# Patient Record
Sex: Female | Born: 1950 | Race: White | Hispanic: No | Marital: Married | Smoking: Never smoker
Health system: Southern US, Community
[De-identification: ages and names within clinical notes are randomized; demographics above are authoritative.]

## PROBLEM LIST (undated history)

## (undated) DIAGNOSIS — M1990 Unspecified osteoarthritis, unspecified site: Secondary | ICD-10-CM

## (undated) DIAGNOSIS — K5900 Constipation, unspecified: Secondary | ICD-10-CM

## (undated) DIAGNOSIS — J45909 Unspecified asthma, uncomplicated: Secondary | ICD-10-CM

## (undated) DIAGNOSIS — F32A Depression, unspecified: Secondary | ICD-10-CM

## (undated) DIAGNOSIS — K219 Gastro-esophageal reflux disease without esophagitis: Secondary | ICD-10-CM

## (undated) DIAGNOSIS — R011 Cardiac murmur, unspecified: Secondary | ICD-10-CM

## (undated) DIAGNOSIS — R42 Dizziness and giddiness: Secondary | ICD-10-CM

## (undated) DIAGNOSIS — D649 Anemia, unspecified: Secondary | ICD-10-CM

## (undated) DIAGNOSIS — K589 Irritable bowel syndrome without diarrhea: Secondary | ICD-10-CM

## (undated) DIAGNOSIS — E079 Disorder of thyroid, unspecified: Secondary | ICD-10-CM

## (undated) DIAGNOSIS — F329 Major depressive disorder, single episode, unspecified: Secondary | ICD-10-CM

## (undated) DIAGNOSIS — E7800 Pure hypercholesterolemia, unspecified: Secondary | ICD-10-CM

## (undated) DIAGNOSIS — E039 Hypothyroidism, unspecified: Secondary | ICD-10-CM

## (undated) DIAGNOSIS — F419 Anxiety disorder, unspecified: Secondary | ICD-10-CM

## (undated) DIAGNOSIS — IMO0002 Reserved for concepts with insufficient information to code with codable children: Secondary | ICD-10-CM

## (undated) DIAGNOSIS — R112 Nausea with vomiting, unspecified: Secondary | ICD-10-CM

## (undated) DIAGNOSIS — M5430 Sciatica, unspecified side: Secondary | ICD-10-CM

## (undated) DIAGNOSIS — R9431 Abnormal electrocardiogram [ECG] [EKG]: Secondary | ICD-10-CM

## (undated) HISTORY — DX: Disorder of thyroid, unspecified: E07.9

## (undated) HISTORY — PX: TONSILLECTOMY: SUR1361

## (undated) HISTORY — DX: Unspecified osteoarthritis, unspecified site: M19.90

## (undated) HISTORY — DX: Sciatica, unspecified side: M54.30

## (undated) HISTORY — DX: Abnormal electrocardiogram (ECG) (EKG): R94.31

## (undated) HISTORY — PX: CHOLECYSTECTOMY: SHX55

## (undated) HISTORY — PX: OTHER SURGICAL HISTORY: SHX169

## (undated) HISTORY — PX: CARPAL TUNNEL RELEASE: SHX101

## (undated) HISTORY — DX: Reserved for concepts with insufficient information to code with codable children: IMO0002

## (undated) HISTORY — DX: Major depressive disorder, single episode, unspecified: F32.9

## (undated) HISTORY — DX: Gastro-esophageal reflux disease without esophagitis: K21.9

## (undated) HISTORY — DX: Nausea with vomiting, unspecified: R11.2

## (undated) HISTORY — DX: Unspecified asthma, uncomplicated: J45.909

## (undated) HISTORY — DX: Anxiety disorder, unspecified: F41.9

## (undated) HISTORY — DX: Constipation, unspecified: K59.00

## (undated) HISTORY — DX: Pure hypercholesterolemia, unspecified: E78.00

## (undated) HISTORY — DX: Anemia, unspecified: D64.9

---

## 1988-07-19 HISTORY — PX: OVARY SURGERY: SHX727

## 1988-09-15 HISTORY — PX: ABDOMINAL HYSTERECTOMY: SHX81

## 1994-10-01 HISTORY — PX: SHOULDER SURGERY: SHX246

## 1998-03-14 ENCOUNTER — Other Ambulatory Visit: Admission: RE | Admit: 1998-03-14 | Discharge: 1998-03-14 | Payer: Self-pay | Admitting: Gynecology

## 1998-03-24 ENCOUNTER — Encounter: Admission: RE | Admit: 1998-03-24 | Discharge: 1998-06-22 | Payer: Self-pay

## 1999-05-25 ENCOUNTER — Other Ambulatory Visit: Admission: RE | Admit: 1999-05-25 | Discharge: 1999-05-25 | Payer: Self-pay | Admitting: Gynecology

## 1999-06-08 ENCOUNTER — Ambulatory Visit (HOSPITAL_COMMUNITY): Admission: RE | Admit: 1999-06-08 | Discharge: 1999-06-08 | Payer: Self-pay | Admitting: *Deleted

## 1999-06-08 ENCOUNTER — Encounter (INDEPENDENT_AMBULATORY_CARE_PROVIDER_SITE_OTHER): Payer: Self-pay

## 2000-06-18 ENCOUNTER — Other Ambulatory Visit: Admission: RE | Admit: 2000-06-18 | Discharge: 2000-06-18 | Payer: Self-pay | Admitting: Gynecology

## 2000-10-01 HISTORY — PX: BREAST LUMPECTOMY: SHX2

## 2001-05-20 ENCOUNTER — Other Ambulatory Visit: Admission: RE | Admit: 2001-05-20 | Discharge: 2001-05-20 | Payer: Self-pay | Admitting: Gynecology

## 2003-03-16 ENCOUNTER — Other Ambulatory Visit: Admission: RE | Admit: 2003-03-16 | Discharge: 2003-03-16 | Payer: Self-pay | Admitting: Gynecology

## 2004-01-05 ENCOUNTER — Encounter: Admission: RE | Admit: 2004-01-05 | Discharge: 2004-01-05 | Payer: Self-pay | Admitting: Critical Care Medicine

## 2004-04-13 ENCOUNTER — Other Ambulatory Visit: Admission: RE | Admit: 2004-04-13 | Discharge: 2004-04-13 | Payer: Self-pay | Admitting: Gynecology

## 2004-08-15 ENCOUNTER — Ambulatory Visit: Payer: Self-pay | Admitting: Hematology & Oncology

## 2004-10-09 ENCOUNTER — Ambulatory Visit: Payer: Self-pay | Admitting: Hematology & Oncology

## 2004-11-13 ENCOUNTER — Ambulatory Visit: Payer: Self-pay | Admitting: General Surgery

## 2004-11-28 ENCOUNTER — Ambulatory Visit: Payer: Self-pay | Admitting: Hematology & Oncology

## 2005-02-15 ENCOUNTER — Encounter (INDEPENDENT_AMBULATORY_CARE_PROVIDER_SITE_OTHER): Payer: Self-pay | Admitting: Specialist

## 2005-02-15 ENCOUNTER — Observation Stay (HOSPITAL_COMMUNITY): Admission: RE | Admit: 2005-02-15 | Discharge: 2005-02-16 | Payer: Self-pay | Admitting: General Surgery

## 2005-02-28 ENCOUNTER — Ambulatory Visit: Payer: Self-pay | Admitting: Hematology & Oncology

## 2005-03-12 ENCOUNTER — Emergency Department (HOSPITAL_COMMUNITY): Admission: EM | Admit: 2005-03-12 | Discharge: 2005-03-12 | Payer: Self-pay | Admitting: Emergency Medicine

## 2005-03-15 ENCOUNTER — Encounter: Admission: RE | Admit: 2005-03-15 | Discharge: 2005-03-15 | Payer: Self-pay | Admitting: General Surgery

## 2005-05-04 ENCOUNTER — Ambulatory Visit: Payer: Self-pay | Admitting: Hematology & Oncology

## 2005-09-11 ENCOUNTER — Other Ambulatory Visit: Admission: RE | Admit: 2005-09-11 | Discharge: 2005-09-11 | Payer: Self-pay | Admitting: Gynecology

## 2006-05-08 ENCOUNTER — Encounter: Admission: RE | Admit: 2006-05-08 | Discharge: 2006-05-08 | Payer: Self-pay | Admitting: Family Medicine

## 2007-07-01 ENCOUNTER — Ambulatory Visit: Payer: Self-pay | Admitting: Hematology & Oncology

## 2007-07-01 LAB — CBC & DIFF AND RETIC
Basophils Absolute: 0 10*3/uL (ref 0.0–0.1)
Eosinophils Absolute: 0.2 10*3/uL (ref 0.0–0.5)
HCT: 28.8 % — ABNORMAL LOW (ref 34.8–46.6)
HGB: 9.6 g/dL — ABNORMAL LOW (ref 11.6–15.9)
IRF: 0.34 — ABNORMAL HIGH (ref 0.130–0.330)
LYMPH%: 16.9 % (ref 14.0–48.0)
MONO#: 0.7 10*3/uL (ref 0.1–0.9)
NEUT%: 72.5 % (ref 39.6–76.8)
Platelets: 489 10*3/uL — ABNORMAL HIGH (ref 145–400)
WBC: 8.1 10*3/uL (ref 3.9–10.0)
lymph#: 1.4 10*3/uL (ref 0.9–3.3)

## 2007-07-01 LAB — FERRITIN: Ferritin: 4 ng/mL — ABNORMAL LOW (ref 10–291)

## 2007-08-07 LAB — CBC & DIFF AND RETIC
BASO%: 0.6 % (ref 0.0–2.0)
Basophils Absolute: 0 10*3/uL (ref 0.0–0.1)
EOS%: 2.1 % (ref 0.0–7.0)
HCT: 33.6 % — ABNORMAL LOW (ref 34.8–46.6)
HGB: 11.3 g/dL — ABNORMAL LOW (ref 11.6–15.9)
IRF: 0.31 (ref 0.130–0.330)
LYMPH%: 18.6 % (ref 14.0–48.0)
MCH: 26.1 pg (ref 26.0–34.0)
MCHC: 33.6 g/dL (ref 32.0–36.0)
MCV: 77.5 fL — ABNORMAL LOW (ref 81.0–101.0)
NEUT%: 70.3 % (ref 39.6–76.8)
Platelets: 426 10*3/uL — ABNORMAL HIGH (ref 145–400)
lymph#: 0.9 10*3/uL (ref 0.9–3.3)

## 2007-08-07 LAB — CHCC SMEAR

## 2007-08-15 ENCOUNTER — Encounter: Admission: RE | Admit: 2007-08-15 | Discharge: 2007-08-15 | Payer: Self-pay | Admitting: Sports Medicine

## 2007-08-22 ENCOUNTER — Encounter: Admission: RE | Admit: 2007-08-22 | Discharge: 2007-08-22 | Payer: Self-pay | Admitting: Sports Medicine

## 2007-08-29 ENCOUNTER — Encounter: Admission: RE | Admit: 2007-08-29 | Discharge: 2007-08-29 | Payer: Self-pay | Admitting: Sports Medicine

## 2007-09-12 ENCOUNTER — Encounter: Admission: RE | Admit: 2007-09-12 | Discharge: 2007-09-12 | Payer: Self-pay | Admitting: Sports Medicine

## 2008-02-05 ENCOUNTER — Encounter: Admission: RE | Admit: 2008-02-05 | Discharge: 2008-02-05 | Payer: Self-pay | Admitting: Sports Medicine

## 2008-03-18 ENCOUNTER — Encounter: Admission: RE | Admit: 2008-03-18 | Discharge: 2008-03-18 | Payer: Self-pay | Admitting: Sports Medicine

## 2008-07-30 ENCOUNTER — Encounter: Admission: RE | Admit: 2008-07-30 | Discharge: 2008-07-30 | Payer: Self-pay | Admitting: Internal Medicine

## 2008-10-18 ENCOUNTER — Encounter: Admission: RE | Admit: 2008-10-18 | Discharge: 2008-10-18 | Payer: Self-pay | Admitting: Sports Medicine

## 2008-11-28 ENCOUNTER — Encounter: Admission: RE | Admit: 2008-11-28 | Discharge: 2008-11-28 | Payer: Self-pay | Admitting: Orthopaedic Surgery

## 2008-11-30 ENCOUNTER — Ambulatory Visit: Payer: Self-pay | Admitting: Hematology & Oncology

## 2008-11-30 LAB — CBC & DIFF AND RETIC
BASO%: 0.7 % (ref 0.0–2.0)
EOS%: 3.2 % (ref 0.0–7.0)
HCT: 25.9 % — ABNORMAL LOW (ref 34.8–46.6)
IRF: 0.28 (ref 0.130–0.330)
MCH: 20.7 pg — ABNORMAL LOW (ref 25.1–34.0)
MCHC: 31.1 g/dL — ABNORMAL LOW (ref 31.5–36.0)
MONO#: 0.5 10*3/uL (ref 0.1–0.9)
NEUT%: 65 % (ref 38.4–76.8)
RBC: 3.89 10*6/uL (ref 3.70–5.45)
Retic %: 2.4 % — ABNORMAL HIGH (ref 0.4–2.3)
WBC: 6.6 10*3/uL (ref 3.9–10.3)
lymph#: 1.5 10*3/uL (ref 0.9–3.3)

## 2008-11-30 LAB — COMPREHENSIVE METABOLIC PANEL
AST: 11 U/L (ref 0–37)
BUN: 13 mg/dL (ref 6–23)
CO2: 26 mEq/L (ref 19–32)
Calcium: 9 mg/dL (ref 8.4–10.5)
Chloride: 106 mEq/L (ref 96–112)
Creatinine, Ser: 0.71 mg/dL (ref 0.40–1.20)
Total Bilirubin: 0.2 mg/dL — ABNORMAL LOW (ref 0.3–1.2)

## 2008-11-30 LAB — FERRITIN: Ferritin: 2 ng/mL — ABNORMAL LOW (ref 10–291)

## 2008-12-06 ENCOUNTER — Ambulatory Visit: Payer: Self-pay | Admitting: Hematology & Oncology

## 2009-01-04 ENCOUNTER — Encounter: Admission: RE | Admit: 2009-01-04 | Discharge: 2009-01-04 | Payer: Self-pay | Admitting: Internal Medicine

## 2009-01-17 LAB — CBC WITH DIFFERENTIAL (CANCER CENTER ONLY)
BASO#: 0 10e3/uL (ref 0.0–0.2)
BASO%: 0.3 % (ref 0.0–2.0)
EOS%: 1.5 % (ref 0.0–7.0)
Eosinophils Absolute: 0.1 10e3/uL (ref 0.0–0.5)
HCT: 35.7 % (ref 34.8–46.6)
HGB: 11.5 g/dL — ABNORMAL LOW (ref 11.6–15.9)
LYMPH#: 1.4 10e3/uL (ref 0.9–3.3)
LYMPH%: 17.7 % (ref 14.0–48.0)
MCH: 25.4 pg — ABNORMAL LOW (ref 26.0–34.0)
MCHC: 32.4 g/dL (ref 32.0–36.0)
MCV: 79 fL — ABNORMAL LOW (ref 81–101)
MONO#: 0.4 10e3/uL (ref 0.1–0.9)
MONO%: 5.4 % (ref 0.0–13.0)
NEUT#: 6.1 10e3/uL (ref 1.5–6.5)
NEUT%: 75.1 % (ref 39.6–80.0)
Platelets: 370 10e3/uL (ref 145–400)
RBC: 4.54 10e6/uL (ref 3.70–5.32)
RDW: 24 % — ABNORMAL HIGH (ref 10.5–14.6)
WBC: 8.2 10e3/uL (ref 3.9–10.0)

## 2009-01-17 LAB — FERRITIN: Ferritin: 20 ng/mL (ref 10–291)

## 2009-01-17 LAB — RETICULOCYTES (CHCC): ABS Retic: 23.3 10*3/uL (ref 19.0–186.0)

## 2009-01-24 ENCOUNTER — Emergency Department (HOSPITAL_BASED_OUTPATIENT_CLINIC_OR_DEPARTMENT_OTHER): Admission: EM | Admit: 2009-01-24 | Discharge: 2009-01-24 | Payer: Self-pay | Admitting: Emergency Medicine

## 2009-01-24 ENCOUNTER — Ambulatory Visit: Payer: Self-pay | Admitting: Hematology & Oncology

## 2009-01-24 ENCOUNTER — Ambulatory Visit: Payer: Self-pay | Admitting: Diagnostic Radiology

## 2009-02-15 ENCOUNTER — Encounter: Admission: RE | Admit: 2009-02-15 | Discharge: 2009-02-15 | Payer: Self-pay | Admitting: Internal Medicine

## 2009-02-22 LAB — CBC WITH DIFFERENTIAL (CANCER CENTER ONLY)
BASO#: 0 10*3/uL (ref 0.0–0.2)
BASO%: 0.4 % (ref 0.0–2.0)
EOS%: 1.9 % (ref 0.0–7.0)
HGB: 12.3 g/dL (ref 11.6–15.9)
LYMPH#: 1.5 10*3/uL (ref 0.9–3.3)
MCH: 28 pg (ref 26.0–34.0)
MCHC: 33.5 g/dL (ref 32.0–36.0)
MONO%: 6.8 % (ref 0.0–13.0)
NEUT#: 4.8 10*3/uL (ref 1.5–6.5)
RDW: 18.1 % — ABNORMAL HIGH (ref 10.5–14.6)

## 2009-02-22 LAB — RETICULOCYTES (CHCC): Retic Ct Pct: 0.7 % (ref 0.4–3.1)

## 2009-02-22 LAB — CHCC SATELLITE - SMEAR

## 2009-03-29 ENCOUNTER — Ambulatory Visit: Payer: Self-pay | Admitting: Hematology & Oncology

## 2009-03-30 LAB — CBC WITH DIFFERENTIAL (CANCER CENTER ONLY)
BASO#: 0.1 10*3/uL (ref 0.0–0.2)
BASO%: 0.6 % (ref 0.0–2.0)
EOS%: 2.4 % (ref 0.0–7.0)
HCT: 38.2 % (ref 34.8–46.6)
HGB: 12.6 g/dL (ref 11.6–15.9)
LYMPH%: 23 % (ref 14.0–48.0)
MCH: 28.9 pg (ref 26.0–34.0)
MCHC: 32.9 g/dL (ref 32.0–36.0)
MCV: 88 fL (ref 81–101)
MONO%: 8.4 % (ref 0.0–13.0)
NEUT%: 65.6 % (ref 39.6–80.0)
RDW: 13.3 % (ref 10.5–14.6)

## 2009-03-30 LAB — FERRITIN: Ferritin: 124 ng/mL (ref 10–291)

## 2009-03-30 LAB — CHCC SATELLITE - SMEAR

## 2009-03-30 LAB — RETICULOCYTES (CHCC): Retic Ct Pct: 1.2 % (ref 0.4–3.1)

## 2009-05-19 ENCOUNTER — Ambulatory Visit: Payer: Self-pay | Admitting: Hematology & Oncology

## 2009-05-20 LAB — CBC WITH DIFFERENTIAL (CANCER CENTER ONLY)
BASO#: 0 10*3/uL (ref 0.0–0.2)
Eosinophils Absolute: 0.2 10*3/uL (ref 0.0–0.5)
HGB: 13.1 g/dL (ref 11.6–15.9)
LYMPH%: 18.3 % (ref 14.0–48.0)
MCH: 29.9 pg (ref 26.0–34.0)
MCHC: 33.6 g/dL (ref 32.0–36.0)
MCV: 89 fL (ref 81–101)
MONO%: 5.9 % (ref 0.0–13.0)
NEUT%: 72.4 % (ref 39.6–80.0)
RBC: 4.38 10*6/uL (ref 3.70–5.32)

## 2009-05-20 LAB — RETICULOCYTES (CHCC)
ABS Retic: 87.6 10*3/uL (ref 19.0–186.0)
RBC.: 4.38 MIL/uL (ref 3.87–5.11)

## 2009-05-20 LAB — CHCC SATELLITE - SMEAR

## 2009-05-20 LAB — FERRITIN: Ferritin: 89 ng/mL (ref 10–291)

## 2009-07-19 ENCOUNTER — Ambulatory Visit: Payer: Self-pay | Admitting: Hematology & Oncology

## 2009-07-20 LAB — CBC WITH DIFFERENTIAL (CANCER CENTER ONLY)
BASO#: 0 10*3/uL (ref 0.0–0.2)
Eosinophils Absolute: 0.2 10*3/uL (ref 0.0–0.5)
HCT: 37.3 % (ref 34.8–46.6)
HGB: 12.6 g/dL (ref 11.6–15.9)
LYMPH#: 1.2 10*3/uL (ref 0.9–3.3)
MCH: 30.2 pg (ref 26.0–34.0)
MONO%: 5.9 % (ref 0.0–13.0)
NEUT#: 4.9 10*3/uL (ref 1.5–6.5)
NEUT%: 72.6 % (ref 39.6–80.0)
RBC: 4.16 10*6/uL (ref 3.70–5.32)

## 2009-07-20 LAB — RETICULOCYTES (CHCC): ABS Retic: 55.1 10*3/uL (ref 19.0–186.0)

## 2009-07-20 LAB — FERRITIN: Ferritin: 61 ng/mL (ref 10–291)

## 2009-08-23 ENCOUNTER — Encounter: Admission: RE | Admit: 2009-08-23 | Discharge: 2009-08-23 | Payer: Self-pay | Admitting: Sports Medicine

## 2009-08-30 ENCOUNTER — Encounter: Payer: Self-pay | Admitting: Sports Medicine

## 2009-09-14 ENCOUNTER — Ambulatory Visit: Payer: Self-pay | Admitting: Sports Medicine

## 2009-09-14 DIAGNOSIS — M79609 Pain in unspecified limb: Secondary | ICD-10-CM | POA: Insufficient documentation

## 2009-09-14 DIAGNOSIS — M7750 Other enthesopathy of unspecified foot: Secondary | ICD-10-CM | POA: Insufficient documentation

## 2009-09-14 DIAGNOSIS — R269 Unspecified abnormalities of gait and mobility: Secondary | ICD-10-CM | POA: Insufficient documentation

## 2009-09-14 DIAGNOSIS — M2170 Unequal limb length (acquired), unspecified site: Secondary | ICD-10-CM

## 2009-10-03 ENCOUNTER — Ambulatory Visit: Payer: Self-pay | Admitting: Hematology & Oncology

## 2009-10-04 LAB — CBC WITH DIFFERENTIAL (CANCER CENTER ONLY)
BASO#: 0 10*3/uL (ref 0.0–0.2)
EOS%: 2 % (ref 0.0–7.0)
HCT: 41.8 % (ref 34.8–46.6)
HGB: 13.8 g/dL (ref 11.6–15.9)
LYMPH#: 1.4 10*3/uL (ref 0.9–3.3)
MCH: 29.1 pg (ref 26.0–34.0)
MCHC: 33.1 g/dL (ref 32.0–36.0)
MCV: 88 fL (ref 81–101)
NEUT%: 76.7 % (ref 39.6–80.0)

## 2009-10-04 LAB — FERRITIN: Ferritin: 68 ng/mL (ref 10–291)

## 2009-10-04 LAB — RETICULOCYTES (CHCC): ABS Retic: 55.7 10*3/uL (ref 19.0–186.0)

## 2009-10-05 ENCOUNTER — Ambulatory Visit: Payer: Self-pay | Admitting: Sports Medicine

## 2009-10-05 DIAGNOSIS — M216X9 Other acquired deformities of unspecified foot: Secondary | ICD-10-CM

## 2009-10-05 DIAGNOSIS — M2040 Other hammer toe(s) (acquired), unspecified foot: Secondary | ICD-10-CM | POA: Insufficient documentation

## 2009-10-05 DIAGNOSIS — D508 Other iron deficiency anemias: Secondary | ICD-10-CM

## 2009-12-09 ENCOUNTER — Ambulatory Visit: Payer: Self-pay | Admitting: General Surgery

## 2009-12-19 ENCOUNTER — Encounter: Admission: RE | Admit: 2009-12-19 | Discharge: 2009-12-19 | Payer: Self-pay | Admitting: Sports Medicine

## 2009-12-20 ENCOUNTER — Ambulatory Visit: Payer: Self-pay | Admitting: Hematology & Oncology

## 2009-12-21 ENCOUNTER — Encounter: Admission: RE | Admit: 2009-12-21 | Discharge: 2009-12-21 | Payer: Self-pay | Admitting: Sports Medicine

## 2009-12-21 LAB — RETICULOCYTES (CHCC)
RBC.: 4.6 MIL/uL (ref 3.87–5.11)
Retic Ct Pct: 1.4 % (ref 0.4–3.1)

## 2009-12-21 LAB — CBC WITH DIFFERENTIAL (CANCER CENTER ONLY)
Eosinophils Absolute: 0.2 10*3/uL (ref 0.0–0.5)
MCH: 29 pg (ref 26.0–34.0)
MONO%: 5.7 % (ref 0.0–13.0)
NEUT#: 6.3 10*3/uL (ref 1.5–6.5)
Platelets: 346 10*3/uL (ref 145–400)
RBC: 4.61 10*6/uL (ref 3.70–5.32)
WBC: 8.4 10*3/uL (ref 3.9–10.0)

## 2010-01-03 HISTORY — PX: FOOT SURGERY: SHX648

## 2010-03-07 ENCOUNTER — Encounter: Admission: RE | Admit: 2010-03-07 | Discharge: 2010-03-07 | Payer: Self-pay | Admitting: Sports Medicine

## 2010-03-07 ENCOUNTER — Ambulatory Visit: Payer: Self-pay | Admitting: Specialist

## 2010-03-24 ENCOUNTER — Ambulatory Visit: Payer: Self-pay | Admitting: Hematology & Oncology

## 2010-03-27 LAB — CBC WITH DIFFERENTIAL (CANCER CENTER ONLY)
BASO#: 0.1 10*3/uL (ref 0.0–0.2)
Eosinophils Absolute: 0.2 10*3/uL (ref 0.0–0.5)
HCT: 39.7 % (ref 34.8–46.6)
HGB: 13.3 g/dL (ref 11.6–15.9)
LYMPH%: 10.4 % — ABNORMAL LOW (ref 14.0–48.0)
MCV: 88 fL (ref 81–101)
MONO#: 0.4 10*3/uL (ref 0.1–0.9)
NEUT%: 84 % — ABNORMAL HIGH (ref 39.6–80.0)
RBC: 4.51 10*6/uL (ref 3.70–5.32)
WBC: 11.2 10*3/uL — ABNORMAL HIGH (ref 3.9–10.0)

## 2010-03-27 LAB — RETICULOCYTES (CHCC): ABS Retic: 91 10*3/uL (ref 19.0–186.0)

## 2010-03-27 LAB — FERRITIN: Ferritin: 31 ng/mL (ref 10–291)

## 2010-03-27 LAB — CHCC SATELLITE - SMEAR

## 2010-03-28 ENCOUNTER — Ambulatory Visit: Payer: Self-pay | Admitting: Surgery

## 2010-03-31 ENCOUNTER — Ambulatory Visit: Payer: Self-pay | Admitting: Cardiothoracic Surgery

## 2010-04-12 ENCOUNTER — Ambulatory Visit: Payer: Self-pay | Admitting: Cardiothoracic Surgery

## 2010-04-18 ENCOUNTER — Ambulatory Visit: Payer: Self-pay | Admitting: Surgery

## 2010-04-25 ENCOUNTER — Ambulatory Visit: Payer: Self-pay | Admitting: Hematology & Oncology

## 2010-04-25 LAB — CBC WITH DIFFERENTIAL (CANCER CENTER ONLY)
BASO#: 0 10*3/uL (ref 0.0–0.2)
Eosinophils Absolute: 0.2 10*3/uL (ref 0.0–0.5)
HGB: 12.4 g/dL (ref 11.6–15.9)
LYMPH#: 1.5 10*3/uL (ref 0.9–3.3)
NEUT#: 4.7 10*3/uL (ref 1.5–6.5)
RBC: 4.24 10*6/uL (ref 3.70–5.32)

## 2010-05-01 ENCOUNTER — Ambulatory Visit: Payer: Self-pay | Admitting: Specialist

## 2010-05-02 ENCOUNTER — Encounter: Admission: RE | Admit: 2010-05-02 | Discharge: 2010-05-02 | Payer: Self-pay | Admitting: Sports Medicine

## 2010-06-27 ENCOUNTER — Ambulatory Visit: Payer: Self-pay | Admitting: Hematology & Oncology

## 2010-06-28 LAB — CBC WITH DIFFERENTIAL (CANCER CENTER ONLY)
BASO#: 0.1 10*3/uL (ref 0.0–0.2)
Eosinophils Absolute: 0.2 10*3/uL (ref 0.0–0.5)
HGB: 14.1 g/dL (ref 11.6–15.9)
LYMPH#: 1.7 10*3/uL (ref 0.9–3.3)
MCH: 29.5 pg (ref 26.0–34.0)
NEUT#: 6.3 10*3/uL (ref 1.5–6.5)
RBC: 4.78 10*6/uL (ref 3.70–5.32)

## 2010-06-28 LAB — RETICULOCYTES (CHCC)
ABS Retic: 60.2 10*3/uL (ref 19.0–186.0)
RBC.: 4.63 MIL/uL (ref 3.87–5.11)
Retic Ct Pct: 1.3 % (ref 0.4–3.1)

## 2010-06-28 LAB — FERRITIN: Ferritin: 128 ng/mL (ref 10–291)

## 2010-07-21 ENCOUNTER — Encounter (INDEPENDENT_AMBULATORY_CARE_PROVIDER_SITE_OTHER): Payer: Self-pay | Admitting: General Surgery

## 2010-07-21 ENCOUNTER — Inpatient Hospital Stay (HOSPITAL_COMMUNITY): Admission: AD | Admit: 2010-07-21 | Discharge: 2010-07-24 | Payer: Self-pay | Admitting: General Surgery

## 2010-07-21 HISTORY — PX: GASTROPLASTY: SHX192

## 2010-09-08 ENCOUNTER — Emergency Department (HOSPITAL_COMMUNITY)
Admission: EM | Admit: 2010-09-08 | Discharge: 2010-09-08 | Payer: Self-pay | Source: Home / Self Care | Admitting: Family Medicine

## 2010-09-12 ENCOUNTER — Encounter
Admission: RE | Admit: 2010-09-12 | Discharge: 2010-09-12 | Payer: Self-pay | Source: Home / Self Care | Attending: Sports Medicine | Admitting: Sports Medicine

## 2010-10-05 ENCOUNTER — Ambulatory Visit: Payer: Self-pay | Admitting: Hematology & Oncology

## 2010-10-06 LAB — RETICULOCYTES (CHCC)
ABS Retic: 43.8 10*3/uL (ref 19.0–186.0)
RBC.: 4.38 MIL/uL (ref 3.87–5.11)
Retic Ct Pct: 1 % (ref 0.4–3.1)

## 2010-10-06 LAB — CBC WITH DIFFERENTIAL (CANCER CENTER ONLY)
BASO#: 0.1 10*3/uL (ref 0.0–0.2)
BASO%: 0.7 % (ref 0.0–2.0)
EOS%: 3.7 % (ref 0.0–7.0)
Eosinophils Absolute: 0.3 10*3/uL (ref 0.0–0.5)
HCT: 40.4 % (ref 34.8–46.6)
HGB: 13.2 g/dL (ref 11.6–15.9)
LYMPH#: 1.8 10*3/uL (ref 0.9–3.3)
LYMPH%: 23 % (ref 14.0–48.0)
MCH: 29.3 pg (ref 26.0–34.0)
MCHC: 32.8 g/dL (ref 32.0–36.0)
MCV: 89 fL (ref 81–101)
MONO#: 0.5 10*3/uL (ref 0.1–0.9)
MONO%: 7 % (ref 0.0–13.0)
NEUT#: 5.1 10*3/uL (ref 1.5–6.5)
NEUT%: 65.6 % (ref 39.6–80.0)
Platelets: 344 10*3/uL (ref 145–400)
RBC: 4.52 10*6/uL (ref 3.70–5.32)
RDW: 12.6 % (ref 10.5–14.6)
WBC: 7.8 10*3/uL (ref 3.9–10.0)

## 2010-10-06 LAB — CHCC SATELLITE - SMEAR

## 2010-10-06 LAB — FERRITIN: Ferritin: 119 ng/mL (ref 10–291)

## 2010-10-09 ENCOUNTER — Encounter
Admission: RE | Admit: 2010-10-09 | Discharge: 2010-10-09 | Payer: Self-pay | Source: Home / Self Care | Attending: Internal Medicine | Admitting: Internal Medicine

## 2010-10-22 ENCOUNTER — Encounter: Payer: Self-pay | Admitting: Sports Medicine

## 2010-10-22 ENCOUNTER — Encounter: Payer: Self-pay | Admitting: General Surgery

## 2010-10-31 NOTE — Assessment & Plan Note (Signed)
Summary: 2:15 APPT, ADJUST ORTHOTICS,[facility]   Vital Signs:  Patient profile:   60 year old female BP sitting:   123 / 82  Vitals Entered By: Lillia Pauls CMA (October 05, 2009 2:16 PM)  History of Present Illness: Ms. Tamara Williams reports to f/u her chronic right forefoot pain. She has sharp pain which is worst on ambulation and relieved by rest. Denies numbness, tingling, discoloration, or swelling. Wearing constructed orthotics. MT pads recently replaced with a small size set. Pain unchanged. Feels as if the arch of her foot cramps during the first few minutes of walking.  Physical Exam  General:  Well-developed,well-nourished,in no acute distress; alert,appropriate and cooperative throughout examination Msk:  ANKLES/FEET: FROM. 5/5 strength. Splaying right 2nd/3rd toes. 2nd toe hammering on the right. Slight supination of  the small toes. Bilateral bunions and bunionettes. Symmetric mild forefoot drop. Borderline Morton's bilaterally. Significantly callous formation on balls of feet No swelling/ttp/discoloration. Sensation intact.    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 [name] is intolerant of significant pressure in this area of her foot.  - MT pad removed from current pair of orthotics given persistent forefoot pain. - 2nd toe hammer toe pad provided to help improve her gait mechanics. - f/u with Dr. Margaretha Sheffield in 1 month.  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 - F/u with Dr. Margaretha Sheffield in 1 month.  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  Problem # 5:  ABNORMALITY OF GAIT  (ICD-781.2)  - Per previous items.

## 2010-11-08 ENCOUNTER — Other Ambulatory Visit: Payer: Self-pay | Admitting: Internal Medicine

## 2010-11-08 ENCOUNTER — Ambulatory Visit
Admission: RE | Admit: 2010-11-08 | Discharge: 2010-11-08 | Disposition: A | Discharge status: Home / Self Care | Payer: 59 | Source: Ambulatory Visit | Attending: Internal Medicine | Admitting: Internal Medicine

## 2010-11-08 DIAGNOSIS — R1032 Left lower quadrant pain: Secondary | ICD-10-CM

## 2010-12-13 LAB — ABO/RH: ABO/RH(D): O POS

## 2010-12-13 LAB — TYPE AND SCREEN: ABO/RH(D): O POS

## 2010-12-25 ENCOUNTER — Other Ambulatory Visit: Payer: Self-pay | Admitting: Sports Medicine

## 2010-12-25 DIAGNOSIS — M7120 Synovial cyst of popliteal space [Baker], unspecified knee: Secondary | ICD-10-CM

## 2010-12-27 ENCOUNTER — Ambulatory Visit
Admission: RE | Admit: 2010-12-27 | Discharge: 2010-12-27 | Disposition: A | Discharge status: Home / Self Care | Payer: 59 | Source: Ambulatory Visit | Attending: Sports Medicine | Admitting: Sports Medicine

## 2010-12-27 DIAGNOSIS — M7120 Synovial cyst of popliteal space [Baker], unspecified knee: Secondary | ICD-10-CM

## 2011-02-01 ENCOUNTER — Encounter (HOSPITAL_BASED_OUTPATIENT_CLINIC_OR_DEPARTMENT_OTHER): Payer: 59 | Admitting: Hematology & Oncology

## 2011-02-01 ENCOUNTER — Other Ambulatory Visit: Payer: Self-pay | Admitting: Hematology & Oncology

## 2011-02-01 DIAGNOSIS — D509 Iron deficiency anemia, unspecified: Secondary | ICD-10-CM

## 2011-02-01 DIAGNOSIS — E559 Vitamin D deficiency, unspecified: Secondary | ICD-10-CM

## 2011-02-01 LAB — CBC WITH DIFFERENTIAL (CANCER CENTER ONLY)
BASO#: 0.1 10*3/uL (ref 0.0–0.2)
BASO%: 0.6 % (ref 0.0–2.0)
EOS%: 3.1 % (ref 0.0–7.0)
HGB: 12.9 g/dL (ref 11.6–15.9)
LYMPH#: 2 10*3/uL (ref 0.9–3.3)
MCHC: 34.4 g/dL (ref 32.0–36.0)
NEUT#: 5.2 10*3/uL (ref 1.5–6.5)
Platelets: 333 10*3/uL (ref 145–400)
RDW: 13 % (ref 11.1–15.7)

## 2011-02-01 LAB — IRON AND TIBC
%SAT: 17 % — ABNORMAL LOW (ref 20–55)
Iron: 57 ug/dL (ref 42–145)
TIBC: 335 ug/dL (ref 250–470)

## 2011-02-01 LAB — CHCC SATELLITE - SMEAR

## 2011-02-01 LAB — FERRITIN: Ferritin: 93 ng/mL (ref 10–291)

## 2011-02-16 NOTE — Op Note (Signed)
Tamara Williams, Tamara Williams                ACCOUNT NO.:  [id number]   MEDICAL RECORD NO.:  [id number]          PATIENT TYPE:  AMB   LOCATION:  DAY                          FACILITY:  [facility]   PHYSICIAN:  Ollen Gross. Vernell Morgans, M.D. DATE OF BIRTH:  1950/11/21   DATE OF PROCEDURE:  02/15/2005  DATE OF DISCHARGE:                                 OPERATIVE REPORT   PREOPERATIVE DIAGNOSIS:  Gallstones.   POSTOPERATIVE DIAGNOSIS:  Gallstones.   PROCEDURE:  Laparoscopic cholecystectomy with intraoperative cholangiogram.   SURGEON:  Ollen Gross. Carolynne Edouard, M.D.   ASSISTANT:  Sandria Bales. Ezzard Standing, M.D.   ANESTHESIA:  General endotracheal.   PROCEDURE:  After informed consent was obtained, the patient was brought to  the operating room and placed in supine position on the operating room  table.  After adequate induction of general anesthesia, the patient's  abdomen was prepped with Betadine and draped in the usual sterile manner.  The area below the umbilicus was infiltrated 0.25% Marcaine.  A small  incision was made with a 15 blade knife.  This incision was carried down  through the subcutaneous tissue bluntly with a Kelly clamp and [employer]  retractors until the linea alba was identified.  The linea alba was incised  with 15 blade knife and each side was grasped with Kocher amps and elevated  anteriorly.  The preperitoneal space was then probed bluntly with a hemostat  until the peritoneum was opened and access was gained to the abdominal  cavity.  A 0 Vicryl pursestring stitch was placed in the fascia surrounding  the opening, a Hasson cannula was placed through the opening and anchored in  placed with the previously-placed  Vicryl pursestring stitch.  The abdomen  was then insufflated with carbon dioxide without difficulty.  The  laparoscope was placed through the Hasson cannula.  The patient was placed  in the head-up position, rotated slightly with right side up.  The right  upper quadrant was inspected and  the dome of gallbladder and the liver  readily identified.  Next the epigastric region was infiltrated 0.25%  Marcaine and a small incision was made with a 15 blade knife and a 10 mm  port was placed bluntly through this incision into the abdominal cavity  under direct vision.  Sites were then chosen lateral on the right side of  the abdomen for placement of 5 mm ports.  These areas were infiltrated with  0.25%% Marcaine.  Small stab incisions were made with a 15 blade knife.  Five millimeter ports were then placed bluntly through these incisions into  the abdominal cavity under direct vision.  A blunt grasper was placed  through the lateralmost 5 mm port and used to grasp the dome of gallbladder  and elevated anteriorly and superiorly.  Another blunt grasper was placed through the other 5 mm port and used to  retract on the body and neck of the gallbladder.  A dissector was placed  through the epigastric port and using the electrocautery, the peritoneal  reflection at the gallbladder neck area was opened.  Blunt dissection was  then carried out in this area until the gallbladder neck-cystic duct  junction was readily identified and a good window was created.  A single  clip was placed on the gallbladder neck.  A small ductotomy was made just  below the clip with the laparoscopic scissors.  A 14 gauge Angiocath was  then placed percutaneously through the anterior abdominal wall under direct  vision.  The Reddick cholangiogram catheter was placed through the Angiocath  and flushed.  The Reddick catheter was then placed within the cystic duct  and anchored in place with a clip.  A cholangiogram was obtained that showed  good length on the cystic duct, no filling defects and good emptying in the  duodenum.  The anchoring clip and catheters were then removed from the  patient.  Three clips were placed proximally on the cystic duct and duct was  divided between the two sets of clips.  Posterior  to this the cystic artery  was identified with the branch.  Two clips were placed proximally and one  distally on each of the branches, of the artery was divided between the two.  Next a laparoscopic hook cautery device was used to separate the gallbladder  from the liver bed.  Prior to completely detaching the gallbladder from the  liver bed, liver bed was inspected and several small bleeding points were  coagulated with the electrocautery until the area was completely hemostatic.  The gallbladder was then detached the rest of the way from the liver bed  without difficulty using the hook electrocautery.  A laparoscopic bag was  placed within the abdomen through the epigastric port.  The gallbladder was  placed within the bag and bag was sealed.  The laparoscope was then moved to the epigastric port and a gallbladder is  was placed through the Hasson cannula and used to grasp the opening of the  bag.  The bag with the gallbladder was then removed through the  infraumbilical port without difficulty.  The fascial defect was closed with  the previously-placed Vicryl pursestring stitch as well as with another  figure-of-eight 0 Vicryl stitch.  The abdomen was irrigated copious amounts  of saline until the effluent was clear.  The rest of ports were removed  under direct vision and were all found to be hemostatic.  The gas was  allowed to escape.  The skin incisions were all closed with interrupted 4-0  Monocryl subcuticular stitches.  Benzoin and Steri-Strips were applied.  The  patient tolerated well.  At the end of the case, all needle, sponge and  instrument counts were correct.  The patient was then awakened and taken to  the recovery room in stable condition.      PST/MEDQ  D:  02/15/2005  T:  02/15/2005  Job:  [id number]

## 2011-05-01 ENCOUNTER — Other Ambulatory Visit: Payer: Self-pay | Admitting: Orthopaedic Surgery

## 2011-05-01 DIAGNOSIS — M545 Low back pain: Secondary | ICD-10-CM

## 2011-05-05 ENCOUNTER — Ambulatory Visit
Admission: RE | Admit: 2011-05-05 | Discharge: 2011-05-05 | Disposition: A | Discharge status: Home / Self Care | Payer: 59 | Source: Ambulatory Visit | Attending: Orthopaedic Surgery | Admitting: Orthopaedic Surgery

## 2011-05-05 DIAGNOSIS — M545 Low back pain: Secondary | ICD-10-CM

## 2011-05-24 ENCOUNTER — Ambulatory Visit: Payer: Self-pay | Admitting: Unknown Physician Specialty

## 2011-05-28 ENCOUNTER — Telehealth (INDEPENDENT_AMBULATORY_CARE_PROVIDER_SITE_OTHER): Payer: Self-pay | Admitting: General Surgery

## 2011-05-30 ENCOUNTER — Telehealth (INDEPENDENT_AMBULATORY_CARE_PROVIDER_SITE_OTHER): Payer: Self-pay

## 2011-05-30 NOTE — Telephone Encounter (Signed)
Pt called the office today to ask for names of GI doctors in [place].  She has been having some stomach issues, and has been seeing Dr. Mechele Collin at the [facility] in [place], [place].  She would like a second opinion.  She will be calling Dr. Loreta Ave.

## 2011-06-14 ENCOUNTER — Ambulatory Visit: Payer: Self-pay | Admitting: Unknown Physician Specialty

## 2011-06-20 ENCOUNTER — Emergency Department (HOSPITAL_COMMUNITY)
Admission: EM | Admit: 2011-06-20 | Discharge: 2011-06-20 | Disposition: A | Discharge status: Home / Self Care | Payer: 59 | Attending: Emergency Medicine | Admitting: Emergency Medicine

## 2011-06-20 ENCOUNTER — Emergency Department (HOSPITAL_COMMUNITY): Payer: 59

## 2011-06-20 DIAGNOSIS — Z79899 Other long term (current) drug therapy: Secondary | ICD-10-CM | POA: Insufficient documentation

## 2011-06-20 DIAGNOSIS — Z9109 Other allergy status, other than to drugs and biological substances: Secondary | ICD-10-CM | POA: Insufficient documentation

## 2011-06-20 DIAGNOSIS — IMO0002 Reserved for concepts with insufficient information to code with codable children: Secondary | ICD-10-CM | POA: Insufficient documentation

## 2011-06-20 DIAGNOSIS — F3289 Other specified depressive episodes: Secondary | ICD-10-CM | POA: Insufficient documentation

## 2011-06-20 DIAGNOSIS — R0789 Other chest pain: Secondary | ICD-10-CM | POA: Insufficient documentation

## 2011-06-20 DIAGNOSIS — Z8249 Family history of ischemic heart disease and other diseases of the circulatory system: Secondary | ICD-10-CM | POA: Insufficient documentation

## 2011-06-20 DIAGNOSIS — K219 Gastro-esophageal reflux disease without esophagitis: Secondary | ICD-10-CM | POA: Insufficient documentation

## 2011-06-20 DIAGNOSIS — J45909 Unspecified asthma, uncomplicated: Secondary | ICD-10-CM | POA: Insufficient documentation

## 2011-06-20 DIAGNOSIS — F329 Major depressive disorder, single episode, unspecified: Secondary | ICD-10-CM | POA: Insufficient documentation

## 2011-06-20 LAB — CBC
HCT: 39.4 % (ref 36.0–46.0)
Hemoglobin: 13.6 g/dL (ref 12.0–15.0)
RBC: 4.5 MIL/uL (ref 3.87–5.11)
WBC: 6.8 10*3/uL (ref 4.0–10.5)

## 2011-06-20 LAB — BASIC METABOLIC PANEL
CO2: 29 mEq/L (ref 19–32)
Calcium: 9.8 mg/dL (ref 8.4–10.5)
Creatinine, Ser: 0.82 mg/dL (ref 0.50–1.10)

## 2011-06-20 LAB — DIFFERENTIAL
Basophils Absolute: 0 10*3/uL (ref 0.0–0.1)
Basophils Relative: 0 % (ref 0–1)
Lymphocytes Relative: 26 % (ref 12–46)
Neutro Abs: 4.4 10*3/uL (ref 1.7–7.7)
Neutrophils Relative %: 64 % (ref 43–77)

## 2011-06-20 LAB — POCT I-STAT TROPONIN I

## 2011-07-11 ENCOUNTER — Other Ambulatory Visit: Payer: Self-pay | Admitting: Hematology & Oncology

## 2011-07-11 ENCOUNTER — Encounter (HOSPITAL_BASED_OUTPATIENT_CLINIC_OR_DEPARTMENT_OTHER): Payer: 59 | Admitting: Hematology & Oncology

## 2011-07-11 DIAGNOSIS — Z23 Encounter for immunization: Secondary | ICD-10-CM

## 2011-07-11 DIAGNOSIS — D509 Iron deficiency anemia, unspecified: Secondary | ICD-10-CM

## 2011-07-11 DIAGNOSIS — E559 Vitamin D deficiency, unspecified: Secondary | ICD-10-CM

## 2011-07-11 LAB — CBC WITH DIFFERENTIAL (CANCER CENTER ONLY)
BASO%: 0.3 % (ref 0.0–2.0)
EOS%: 1.5 % (ref 0.0–7.0)
LYMPH%: 12 % — ABNORMAL LOW (ref 14.0–48.0)
MCH: 30.9 pg (ref 26.0–34.0)
MCHC: 34.9 g/dL (ref 32.0–36.0)
MCV: 89 fL (ref 81–101)
MONO#: 0.2 10*3/uL (ref 0.1–0.9)
MONO%: 2.1 % (ref 0.0–13.0)
Platelets: 356 10*3/uL (ref 145–400)
RDW: 13 % (ref 11.1–15.7)
WBC: 7.3 10*3/uL (ref 3.9–10.0)

## 2011-09-12 ENCOUNTER — Other Ambulatory Visit: Payer: Self-pay | Admitting: Rehabilitation

## 2011-09-12 DIAGNOSIS — M545 Low back pain: Secondary | ICD-10-CM

## 2011-09-15 ENCOUNTER — Ambulatory Visit
Admission: RE | Admit: 2011-09-15 | Discharge: 2011-09-15 | Disposition: A | Discharge status: Home / Self Care | Payer: 59 | Source: Ambulatory Visit | Attending: Rehabilitation | Admitting: Rehabilitation

## 2011-09-15 DIAGNOSIS — M545 Low back pain: Secondary | ICD-10-CM

## 2011-10-29 ENCOUNTER — Other Ambulatory Visit: Payer: Self-pay | Admitting: Sports Medicine

## 2011-10-29 DIAGNOSIS — M25572 Pain in left ankle and joints of left foot: Secondary | ICD-10-CM

## 2011-10-31 ENCOUNTER — Ambulatory Visit
Admission: RE | Admit: 2011-10-31 | Discharge: 2011-10-31 | Disposition: A | Discharge status: Home / Self Care | Payer: 59 | Source: Ambulatory Visit | Attending: Sports Medicine | Admitting: Sports Medicine

## 2011-10-31 DIAGNOSIS — M25572 Pain in left ankle and joints of left foot: Secondary | ICD-10-CM

## 2011-11-02 ENCOUNTER — Other Ambulatory Visit: Payer: 59

## 2011-12-14 IMAGING — CT CT CHEST W/ CM
1 series · 15 of 31 positions shown, 19 images · IV contrast (agent unspecified)
Comparison: None

REASON FOR EXAM: Rt Lower Lobe Infilitrate
COMMENTS:

PROCEDURE:     CT  - CT CHEST WITH CONTRAST  - March 07, 2010 [DATE]
RESULT:     Indication: Right lower lobe infiltrate
TECHNIQUE: Multiple axial images of the chest are obtained with 75 mL of
Hsovue-8TI intravenous contrast.

[Series 2: soft tissue · axial · 0.80mm/px · z∈[-212,+53]mm · 15 of 59 slices shown, 19 images]
[im 3/59  mediastinal]
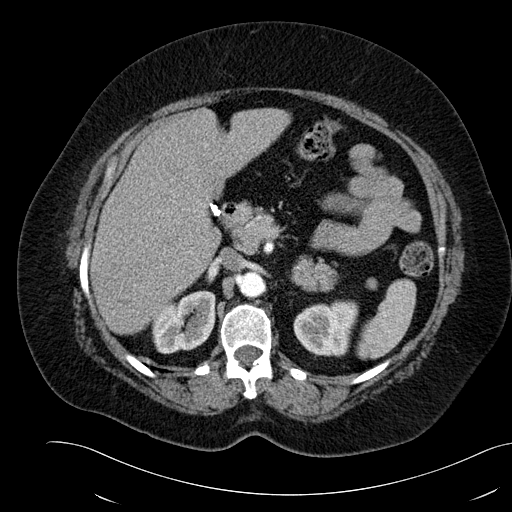
[im 3/59  lung]
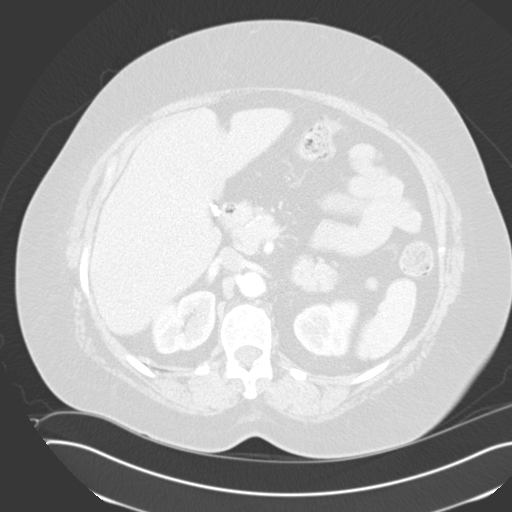
[im 7/59  lung]
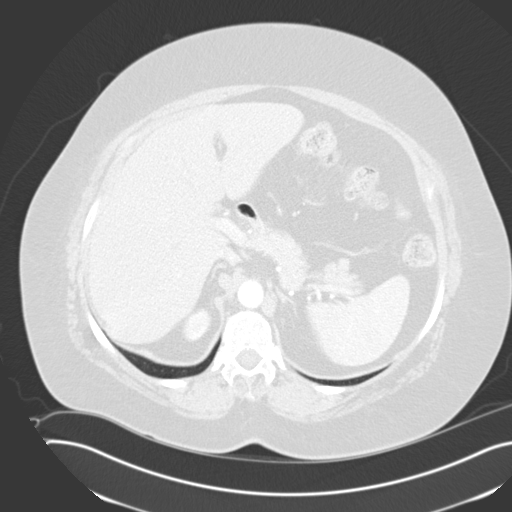
[im 11/59  lung]
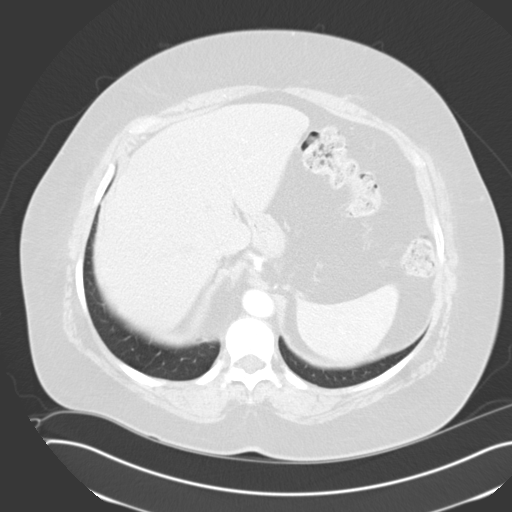
[im 13/59  lung]
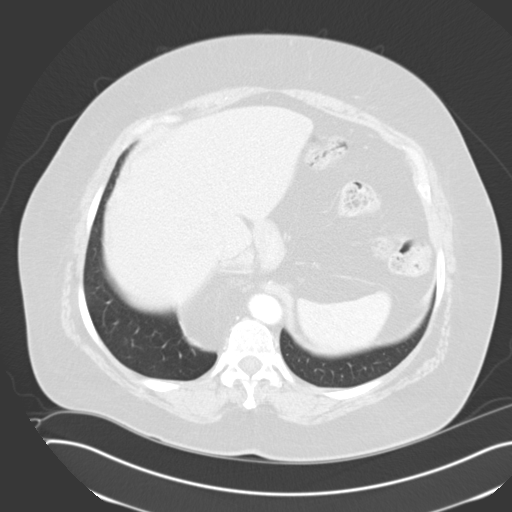
[im 18/59  mediastinal]
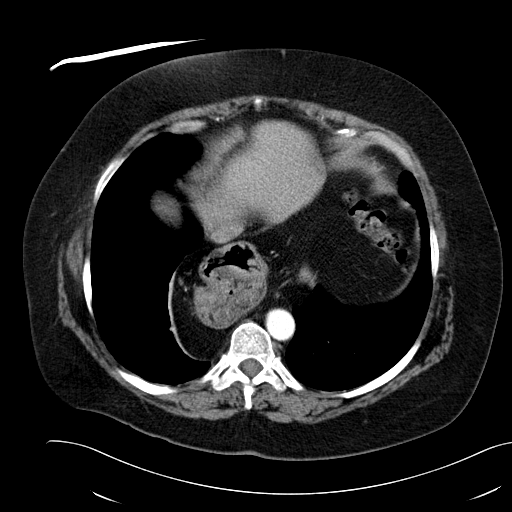
[im 18/59  lung]
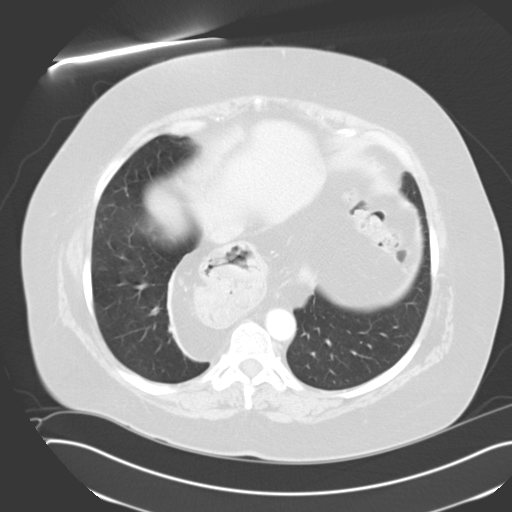
[im 22/59  lung]
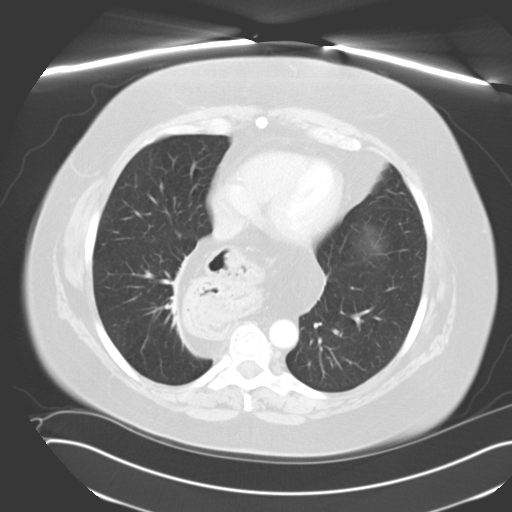
[im 26/59  lung]
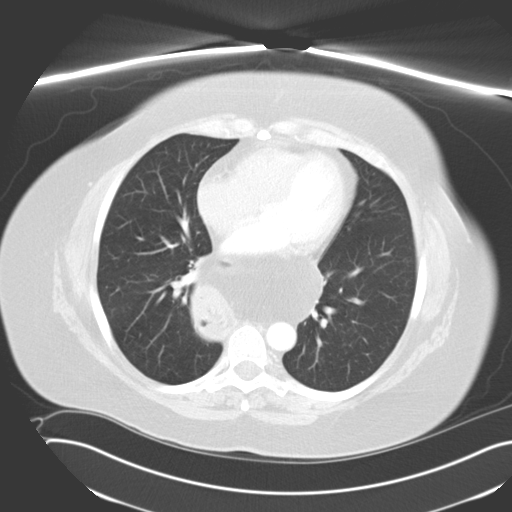
[im 31/59  lung]
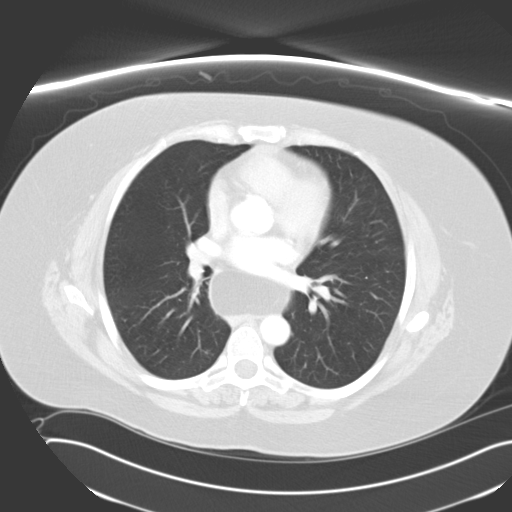
[im 33/59  mediastinal]
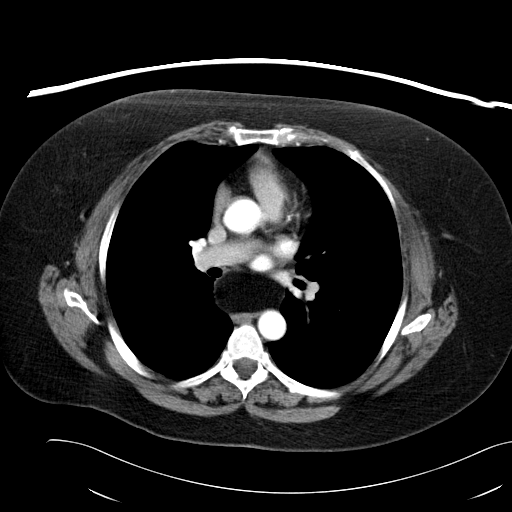
[im 33/59  lung]
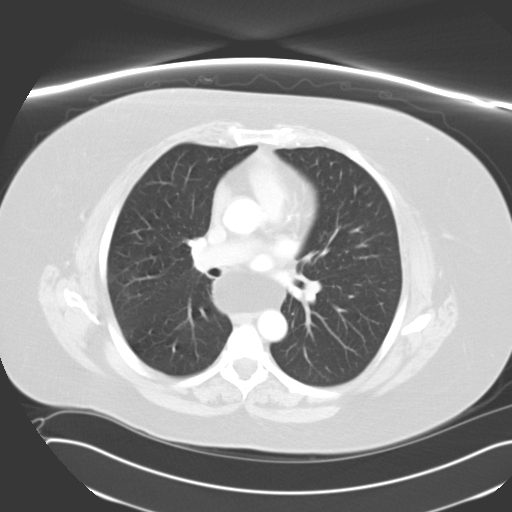
[im 37/59  lung]
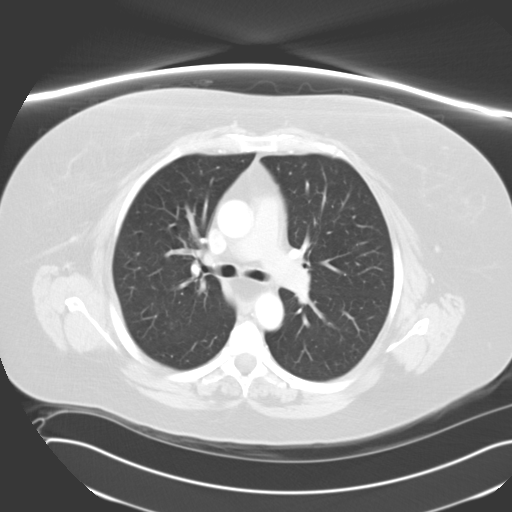
[im 41/59  lung]
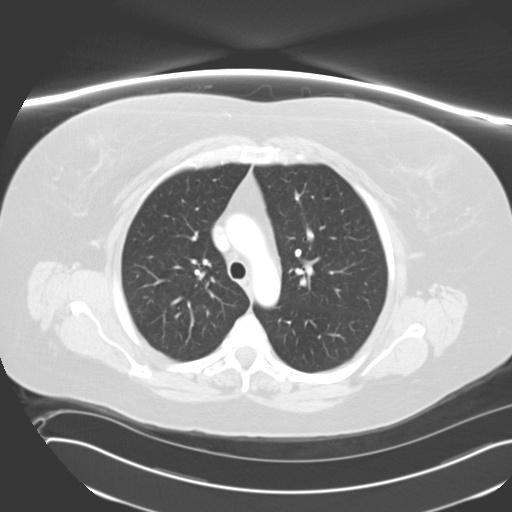
[im 46/59  lung]
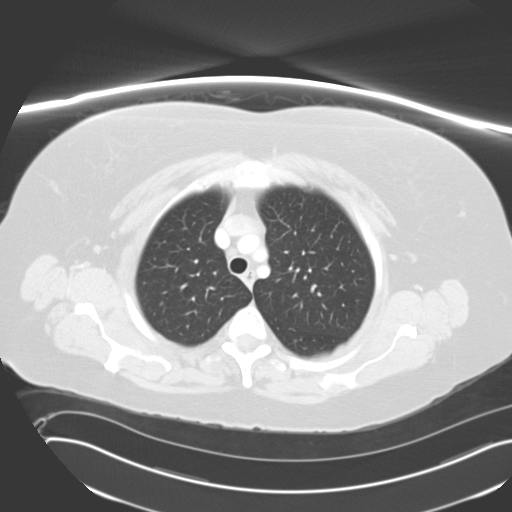
[im 48/59  mediastinal]
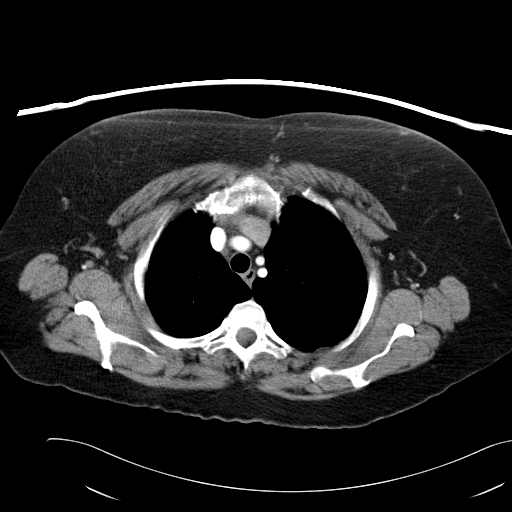
[im 48/59  lung]
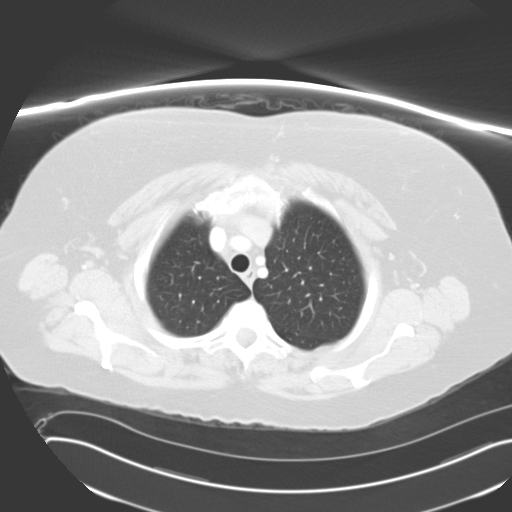
[im 52/59  lung]
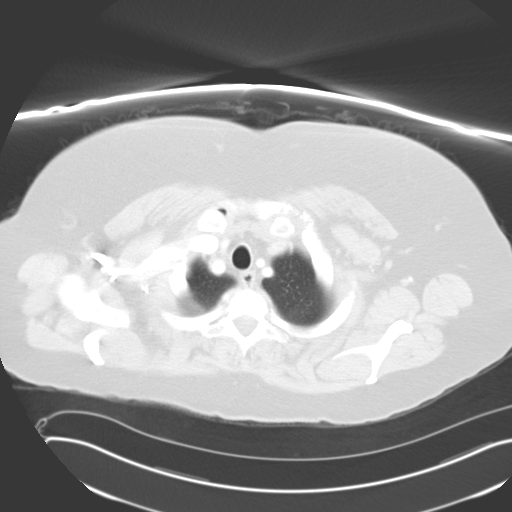
[im 56/59  lung]
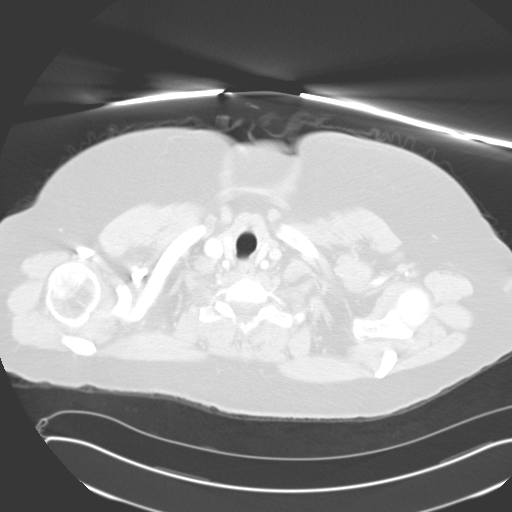

[15 of 31 positions shown; findings below may reference images not displayed]

FINDINGS: The central airways are patent. The lungs are clear. There is no pleural
effusion or pneumothorax.

There are no pathologically enlarged axillary, hilar, or mediastinal lymph
nodes.

The heart size is normal. There is no pericardial effusion. The thoracic
aorta is normal in caliber.  There is coronary artery atherosclerosis in the
LAD.

Review of bone windows demonstrates no focal lytic or sclerotic lesions.

Limited noncontrast images of the upper abdomen were obtained. The adrenal
glands appear normal.

There is a large hila hernia with the majority of the stomach above the
diaphragm. There is a a component of organoaxial malrotation of the stomach
IMPRESSION: 1. No acute disease of the chest.

2. There is a large hila hernia with the majority of the stomach above the
diaphragm. There is a a component of organoaxial malrotation of the stomach.
There is no CT evidence of gastric outlet obstruction.

3. Coronary artery disease in the LAD.

## 2012-01-10 ENCOUNTER — Encounter: Payer: Self-pay | Admitting: Hematology & Oncology

## 2012-01-10 ENCOUNTER — Ambulatory Visit (HOSPITAL_BASED_OUTPATIENT_CLINIC_OR_DEPARTMENT_OTHER): Payer: 59 | Admitting: Hematology & Oncology

## 2012-01-10 ENCOUNTER — Other Ambulatory Visit (HOSPITAL_BASED_OUTPATIENT_CLINIC_OR_DEPARTMENT_OTHER): Payer: 59 | Admitting: Lab

## 2012-01-10 VITALS — BP 117/68 | HR 69 | Temp 97.2°F | Ht 62.0 in | Wt 183.0 lb

## 2012-01-10 DIAGNOSIS — E559 Vitamin D deficiency, unspecified: Secondary | ICD-10-CM

## 2012-01-10 DIAGNOSIS — D509 Iron deficiency anemia, unspecified: Secondary | ICD-10-CM

## 2012-01-10 LAB — RETICULOCYTES (CHCC)
ABS Retic: 45 10*3/uL (ref 19.0–186.0)
RBC.: 4.5 MIL/uL (ref 3.87–5.11)

## 2012-01-10 LAB — CBC WITH DIFFERENTIAL (CANCER CENTER ONLY)
BASO%: 0.6 % (ref 0.0–2.0)
EOS%: 1.9 % (ref 0.0–7.0)
LYMPH#: 1.9 10*3/uL (ref 0.9–3.3)
MONO#: 0.6 10*3/uL (ref 0.1–0.9)
NEUT#: 4 10*3/uL (ref 1.5–6.5)
Platelets: 327 10*3/uL (ref 145–400)
RDW: 12.6 % (ref 11.1–15.7)
WBC: 6.8 10*3/uL (ref 3.9–10.0)

## 2012-01-10 LAB — CHCC SATELLITE - SMEAR

## 2012-01-10 LAB — IRON AND TIBC: TIBC: 349 ug/dL (ref 250–470)

## 2012-01-10 NOTE — Progress Notes (Signed)
This office note has been dictated.

## 2012-01-14 NOTE — Progress Notes (Signed)
DIAGNOSIS:  Iron-deficiency anemia.  CURRENT THERAPY:  Observation.  INTERIM HISTORY:  Tamara Williams comes in for followup.  She is doing well. We see her every 6 months.  I believe this will be the last time that we have to see her.  She has had no problems since we last saw her.  She has not gotten iron now since August 2011.  She has had no problems with bleeding or bruising.  She has had no fatigue or weakness.  She has had no nausea or vomiting.  She has had no leg swelling.  Her last iron studies were done back in May 2012.  Her ferritin at that point in time was 93.  Iron saturation was 17%.  She has had no, I think, change in her medications.  She did fall and, I think sprained her left ankle.  This was, I think back before Christmas.  She did have an MRI of the ankle in, I think January.  She was noted to have a calcaneal fracture.  There was some slight subluxation.  She has been in and out of a support boot.  She has a brace on right now.  PHYSICAL EXAMINATION:  General:  This is a well-developed, well- nourished white female in no obvious distress.  Vital Signs: Temperature 97.2, pulse 69, respiratory rate 20, blood pressure 117/68, weight is 183.  Head and Neck Exam:  Shows a normocephalic, atraumatic skull.  There are no ocular or oral lesions.  There are no palpable cervical or supraclavicular lymph nodes.  Lungs:  Clear to percussion and auscultation bilaterally.  Cardiac Exam:  Regular rate and rhythm with a normal S1 and S2.  There are no murmurs, rubs, or bruits. Abdominal Exam:  Soft with good bowel sounds.  There is no palpable abdominal mass.  There is no fluid wave.  There is no palpable hepatosplenomegaly.  Back Exam:  No tenderness over the spine, ribs, or hips.  Extremities:  Show the brace on her left ankle.  She has no swelling in her legs.  Skin Exam:  No rash, ecchymosis, or petechia.  LABORATORY STUDIES:  White cell count 6.8, hemoglobin 13.3,  hematocrit 39.3, platelet count 327.  MCV is 89.  IMPRESSION:  Tamara Williams is a very nice 61 year old white female with a history of iron deficiency anemia.  Again, we have not given her iron now for a year and a half.  Her blood count is holding stable.  I do not think we have to get her back to the office now.  Again, everything is looking fine.  I just think that we are not adding much to her medical care.  I just do not want to waste her time.  Again, we have good fellowship, so it is nice seeing her.  I told Tamara Williams that she can always come back to see [place] at any time if she does have problems with fatigue or weakness.    ______________________________ Josph Macho, M.D. PRE/MEDQ  D:  01/10/2012  T:  01/11/2012  Job:  [id number]

## 2012-01-23 ENCOUNTER — Telehealth: Payer: Self-pay | Admitting: *Deleted

## 2012-01-23 NOTE — Telephone Encounter (Signed)
Called patient to let her know that her iron levels were good per dr. Myna Hidalgo.

## 2012-01-23 NOTE — Telephone Encounter (Signed)
Message copied by Anselm Jungling on Wed Jan 23, 2012 11:06 AM ------      Message from: Arlan Organ R      Created: Thu Jan 17, 2012  6:14 PM       Call- iron is ok; pete

## 2012-02-04 ENCOUNTER — Ambulatory Visit: Payer: Self-pay | Admitting: Gastroenterology

## 2012-02-05 LAB — PATHOLOGY REPORT

## 2012-03-13 ENCOUNTER — Other Ambulatory Visit: Payer: Self-pay | Admitting: Orthopaedic Surgery

## 2012-03-13 DIAGNOSIS — M549 Dorsalgia, unspecified: Secondary | ICD-10-CM

## 2012-03-14 ENCOUNTER — Other Ambulatory Visit: Payer: Self-pay | Admitting: Gastroenterology

## 2012-03-14 DIAGNOSIS — R109 Unspecified abdominal pain: Secondary | ICD-10-CM

## 2012-03-22 ENCOUNTER — Other Ambulatory Visit: Payer: Self-pay | Admitting: Gastroenterology

## 2012-03-22 ENCOUNTER — Ambulatory Visit
Admission: RE | Admit: 2012-03-22 | Discharge: 2012-03-22 | Disposition: A | Discharge status: Home / Self Care | Payer: 59 | Source: Ambulatory Visit | Attending: Gastroenterology | Admitting: Gastroenterology

## 2012-03-22 ENCOUNTER — Other Ambulatory Visit: Payer: 59

## 2012-03-22 ENCOUNTER — Ambulatory Visit
Admission: RE | Admit: 2012-03-22 | Discharge: 2012-03-22 | Disposition: A | Discharge status: Home / Self Care | Payer: 59 | Source: Ambulatory Visit | Attending: Orthopaedic Surgery | Admitting: Orthopaedic Surgery

## 2012-03-22 DIAGNOSIS — R109 Unspecified abdominal pain: Secondary | ICD-10-CM

## 2012-03-22 DIAGNOSIS — M549 Dorsalgia, unspecified: Secondary | ICD-10-CM

## 2012-03-23 ENCOUNTER — Other Ambulatory Visit: Payer: 59

## 2012-05-12 ENCOUNTER — Telehealth: Payer: Self-pay | Admitting: Hematology & Oncology

## 2012-05-12 NOTE — Telephone Encounter (Signed)
Patient called and spoke with RN.  Per RN to sch appt for patient lab and French Ana.  Pt sch appt for 05/13/12 at 2pm

## 2012-05-13 ENCOUNTER — Ambulatory Visit (HOSPITAL_BASED_OUTPATIENT_CLINIC_OR_DEPARTMENT_OTHER): Payer: 59 | Admitting: Medical

## 2012-05-13 ENCOUNTER — Other Ambulatory Visit (HOSPITAL_BASED_OUTPATIENT_CLINIC_OR_DEPARTMENT_OTHER): Payer: 59 | Admitting: Lab

## 2012-05-13 VITALS — BP 102/70 | HR 75 | Temp 97.4°F | Resp 20 | Ht 62.0 in | Wt 174.0 lb

## 2012-05-13 DIAGNOSIS — D509 Iron deficiency anemia, unspecified: Secondary | ICD-10-CM

## 2012-05-13 LAB — CHCC SATELLITE - SMEAR

## 2012-05-13 LAB — CBC WITH DIFFERENTIAL (CANCER CENTER ONLY)
BASO#: 0 10*3/uL (ref 0.0–0.2)
Eosinophils Absolute: 0.1 10*3/uL (ref 0.0–0.5)
HGB: 12.3 g/dL (ref 11.6–15.9)
MCH: 30.2 pg (ref 26.0–34.0)
MCV: 90 fL (ref 81–101)
MONO#: 0.5 10*3/uL (ref 0.1–0.9)
MONO%: 7.6 % (ref 0.0–13.0)
NEUT#: 4.1 10*3/uL (ref 1.5–6.5)
RBC: 4.07 10*6/uL (ref 3.70–5.32)
WBC: 6.2 10*3/uL (ref 3.9–10.0)

## 2012-05-13 LAB — RETICULOCYTES (CHCC)
ABS Retic: 33.3 10*3/uL (ref 19.0–186.0)
RBC.: 4.16 MIL/uL (ref 3.87–5.11)

## 2012-05-13 LAB — IRON AND TIBC
%SAT: 12 % — ABNORMAL LOW (ref 20–55)
TIBC: 300 ug/dL (ref 250–470)

## 2012-05-13 LAB — FERRITIN: Ferritin: 61 ng/mL (ref 10–291)

## 2012-05-13 NOTE — Progress Notes (Signed)
Diagnosis: Iron deficiency anemia, new.  Current therapy: Observation.  Interim history: Tamara Williams presents today because she says she started feeling tired recently.  She was last seen back in April, 2013, and all her blood work was excellent.  Dr. Myna Hidalgo told her at that point really didn't need to see her back unless she was having problems.  She has not received iron since August 2011.  She's not had any problems with any bleeding or bruising.  She's not having any weakness.  She has a good appetite.  She denies any nausea, vomiting, diarrhea, constipation.  She's not having any leg swelling.  Her last ferritin was 58, and iron 78, with 22% saturation.  Back in April, 2013.  She denies any cough, chest pain, shortness of breath, any fevers, chills, night sweats, or obvious, bleeding.  I believe Tamara Williams, just wanted to make sure that her blood work still continued to look good.  Review of Systems: Fatigue, otherwise: Pt. Denies any changes in their vision, hearing, adenopathy, fevers, chills, nausea, vomiting, diarrhea, constipation, chest pain, shortness of breath, passing blood, passing out, blacking out,  any changes in skin, joints, neurologic or psychiatric except as noted.  Physical Exam: This is a pleasant, well-developed, well-nourished, 61 year old, white female, in no obvious distress Vitals: Temperature 97.4 degrees.  Pulse 75, respirations 20, blood pressure 102/70, weight 174 pounds HEENT reveals a normocephalic, atraumatic skull, no scleral icterus, no oral lesions  Neck is supple without any cervical or supraclavicular adenopathy.  Lungs are clear to auscultation bilaterally. There are no wheezes, rales or rhonci Cardiac is regular rate and rhythm with a normal S1 and S2. There are no murmurs, rubs, or bruits.  Abdomen is soft with good bowel sounds, there is no palpable mass. There is no palpable hepatosplenomegaly. There is no palpable fluid wave.  Musculoskeletal no tenderness  of the spine, ribs, or hips.  Extremities there are no clubbing, cyanosis, or edema.  Skin no petechia, purpura or ecchymosis Neurologic is nonfocal.  Laboratory Data: White count 6.2, hemoglobin 12.3, hematocrit 36.8, platelets 288,000 Ferritin, and iron panel are pending from today  Current Outpatient Prescriptions on File Prior to Visit  Medication Sig Dispense Refill  . budesonide (PULMICORT) 0.5 MG/2ML nebulizer solution 2 (two) times daily.       Marland Kitchen buPROPion (WELLBUTRIN SR) 150 MG 12 hr tablet Twice daily.      . Calcium Carbonate-Vitamin D (CALCIUM + D PO) Take by mouth 2 (two) times daily.      Marland Kitchen DEXILANT 60 MG capsule daily.       Marland Kitchen EPIPEN 2-PAK 0.3 MG/0.3ML DEVI as directed.      . hyoscyamine (LEVSIN SL) 0.125 MG SL tablet as needed.       Marland Kitchen levothyroxine (SYNTHROID, LEVOTHROID) 25 MCG tablet daily.       . meloxicam (MOBIC) 15 MG tablet as needed.       . metaxalone (SKELAXIN) 800 MG tablet as directed.      . montelukast (SINGULAIR) 10 MG tablet every morning.      . QNASL 80 MCG/ACT AERS every morning.      . traMADol (ULTRAM) 50 MG tablet as needed.      . traZODone (DESYREL) 50 MG tablet Take 50 mg by mouth at bedtime.      . VENTOLIN HFA 108 (90 BASE) MCG/ACT inhaler as needed.      . Vitamin D, Ergocalciferol, (DRISDOL) 50000 UNITS CAPS once a week.       Marland Kitchen  XOPENEX 1.25 MG/3ML nebulizer solution as needed.      . promethazine-codeine (PHENERGAN WITH CODEINE) 6.25-10 MG/5ML syrup as directed.       Assessment/Plan: This is a pleasant, 61 year old, female, female, with the following issues: #1.  History of iron deficiency anemia.  Again, we have not given her iron now for close to 2 years.  We will see what her iron level comes back as and if she needs iron we will let her know.  #2 followup-Tamara Williams will let us know if she is having any problems at which time, we may need to see her back.

## 2012-05-22 ENCOUNTER — Other Ambulatory Visit: Payer: Self-pay | Admitting: Hematology & Oncology

## 2012-05-23 ENCOUNTER — Ambulatory Visit (HOSPITAL_BASED_OUTPATIENT_CLINIC_OR_DEPARTMENT_OTHER): Payer: 59

## 2012-05-23 ENCOUNTER — Ambulatory Visit: Payer: 59

## 2012-05-23 DIAGNOSIS — R269 Unspecified abnormalities of gait and mobility: Secondary | ICD-10-CM

## 2012-05-23 DIAGNOSIS — M79609 Pain in unspecified limb: Secondary | ICD-10-CM

## 2012-05-23 DIAGNOSIS — M7750 Other enthesopathy of unspecified foot: Secondary | ICD-10-CM

## 2012-05-23 DIAGNOSIS — M2170 Unequal limb length (acquired), unspecified site: Secondary | ICD-10-CM

## 2012-05-23 DIAGNOSIS — M2040 Other hammer toe(s) (acquired), unspecified foot: Secondary | ICD-10-CM

## 2012-05-23 DIAGNOSIS — M216X9 Other acquired deformities of unspecified foot: Secondary | ICD-10-CM

## 2012-05-23 DIAGNOSIS — D509 Iron deficiency anemia, unspecified: Secondary | ICD-10-CM

## 2012-05-23 MED ORDER — SODIUM CHLORIDE 0.9 % IV SOLN
1020.0000 mg | Freq: Once | INTRAVENOUS | Status: AC
  Administered 2012-05-23: 1020 mg via INTRAVENOUS
  Filled 2012-05-23: qty 34

## 2012-05-23 MED ORDER — SODIUM CHLORIDE 0.9 % IV SOLN
Freq: Once | INTRAVENOUS | Status: AC
  Administered 2012-05-23: 13:00:00 via INTRAVENOUS

## 2012-05-23 NOTE — Patient Instructions (Signed)
Ferumoxytol injection What is this medicine? FERUMOXYTOL is an iron complex. Iron is used to make healthy red blood cells, which carry oxygen and nutrients throughout the body. This medicine is used to treat iron deficiency anemia in people with chronic kidney disease. This medicine may be used for other purposes; ask your health care provider or pharmacist if you have questions. What should I tell my health care provider before I take this medicine? They need to know if you have any of these conditions: -anemia not caused by low iron levels -high levels of iron in the blood -magnetic resonance imaging (MRI) test scheduled -an unusual or allergic reaction to iron, other medicines, foods, dyes, or preservatives -pregnant or trying to get pregnant -breast-feeding How should I use this medicine? This medicine is for infusion into a vein. It is given by a health care professional in a hospital or clinic setting. Talk to your pediatrician regarding the use of this medicine in children. Special care may be needed. Overdosage: If you think you've taken too much of this medicine contact a poison control center or emergency room at once. Overdosage: If you think you have taken too much of this medicine contact a poison control center or emergency room at once. NOTE: This medicine is only for you. Do not share this medicine with others. What if I miss a dose? It is important not to miss your dose. Call your doctor or health care professional if you are unable to keep an appointment. What may interact with this medicine? This medicine may interact with the following medications: -other iron products This list may not describe all possible interactions. Give your health care provider a list of all the medicines, herbs, non-prescription drugs, or dietary supplements you use. Also tell them if you smoke, drink alcohol, or use illegal drugs. Some items may interact with your medicine. What should I watch  for while using this medicine? Visit your doctor or healthcare professional regularly. Tell your doctor or healthcare professional if your symptoms do not start to get better or if they get worse. You may need blood work done while you are taking this medicine. You may need to follow a special diet. Talk to your doctor. Foods that contain iron include: whole grains/cereals, dried fruits, beans, or peas, leafy green vegetables, and organ meats (liver, kidney). What side effects may I notice from receiving this medicine? Side effects that you should report to your doctor or health care professional as soon as possible: -allergic reactions like skin rash, itching or hives, swelling of the face, lips, or tongue -breathing problems -changes in blood pressure -feeling faint or lightheaded, falls -fever or chills -flushing, sweating, or hot feelings -swelling of the ankles or feet Side effects that usually do not require medical attention (Report these to your doctor or health care professional if they continue or are bothersome.): -diarrhea -headache -nausea, vomiting -stomach pain This list may not describe all possible side effects. Call your doctor for medical advice about side effects. You may report side effects to FDA at 1-800-FDA-1088. Where should I keep my medicine? This drug is given in a hospital or clinic and will not be stored at home. NOTE: This sheet is a summary. It may not cover all possible information. If you have questions about this medicine, talk to your doctor, pharmacist, or health care provider.  2012, [employer]. (06/09/2008 9:48:25 PM) 

## 2012-05-26 ENCOUNTER — Ambulatory Visit: Payer: 59

## 2012-07-22 ENCOUNTER — Other Ambulatory Visit: Payer: Self-pay | Admitting: Medical

## 2012-07-22 DIAGNOSIS — D509 Iron deficiency anemia, unspecified: Secondary | ICD-10-CM

## 2012-07-23 ENCOUNTER — Telehealth: Payer: Self-pay | Admitting: Hematology & Oncology

## 2012-07-23 ENCOUNTER — Other Ambulatory Visit: Payer: 59 | Admitting: Lab

## 2012-07-23 ENCOUNTER — Ambulatory Visit: Payer: 59 | Admitting: Medical

## 2012-07-23 NOTE — Telephone Encounter (Signed)
Patient called and cx 07-23-12 appt due to being sick.  She resch for 07-30-12.  Darl Pikes was notified of cx appt.

## 2012-07-30 ENCOUNTER — Other Ambulatory Visit (HOSPITAL_BASED_OUTPATIENT_CLINIC_OR_DEPARTMENT_OTHER): Payer: 59 | Admitting: Lab

## 2012-07-30 ENCOUNTER — Ambulatory Visit (HOSPITAL_BASED_OUTPATIENT_CLINIC_OR_DEPARTMENT_OTHER): Payer: 59 | Admitting: Medical

## 2012-07-30 VITALS — BP 123/78 | HR 78 | Temp 98.1°F | Resp 18 | Ht 62.0 in | Wt 174.0 lb

## 2012-07-30 DIAGNOSIS — D509 Iron deficiency anemia, unspecified: Secondary | ICD-10-CM

## 2012-07-30 LAB — RETICULOCYTES (CHCC)
RBC.: 4.55 MIL/uL (ref 3.87–5.11)
Retic Ct Pct: 1.2 % (ref 0.4–2.3)

## 2012-07-30 LAB — CBC WITH DIFFERENTIAL (CANCER CENTER ONLY)
BASO#: 0 10*3/uL (ref 0.0–0.2)
BASO%: 0.5 % (ref 0.0–2.0)
EOS%: 2.8 % (ref 0.0–7.0)
HGB: 13.5 g/dL (ref 11.6–15.9)
LYMPH#: 1.7 10*3/uL (ref 0.9–3.3)
MCH: 30.5 pg (ref 26.0–34.0)
MCHC: 33.8 g/dL (ref 32.0–36.0)
MONO%: 10.2 % (ref 0.0–13.0)
NEUT#: 3.6 10*3/uL (ref 1.5–6.5)
Platelets: 303 10*3/uL (ref 145–400)
RDW: 12.7 % (ref 11.1–15.7)

## 2012-07-30 LAB — IRON AND TIBC
TIBC: 290 ug/dL (ref 250–470)
UIBC: 209 ug/dL (ref 125–400)

## 2012-07-30 NOTE — Progress Notes (Signed)
Diagnosis: Iron deficiency anemia  Current therapy: IV iron as indicated (last dose of IV iron was on 05/23/2012)  Interim history: For an office followup visit.  We did end up having to give her IV iron back on 05/23/2012.  Her iron at that time, was 36, with 12% saturation.  She was also symptomatic stating that she was very fatigued.  .  She, reports, that, overall, she's doing relatively well..  She's not having any excessive fatigue or tiredness or weakness.  She reports, she has a decent appetite.  She denies  She denies any cough, chest pain, shortness of breath, any fevers, chills, night sweats, or obvious, bleeding. .  She denies any nausea, vomiting, diarrhea, or constipation.  Overall, she is able to get out, and do her activities of daily living without any hindrance or decline.    Review of Systems: Fatigue, otherwise: Pt. Denies any changes in their vision, hearing, adenopathy, fevers, chills, nausea, vomiting, diarrhea, constipation, chest pain, shortness of breath, passing blood, passing out, blacking out,  any changes in skin, joints, neurologic or psychiatric except as noted.  Physical Exam: This is a pleasant, well-developed, well-nourished, 61 year old, white female, in no obvious distress Vitals:  temperature 97.8 degrees, pulse 60, respirations 16, blood pressure 105/53, weight 173 pounds  HEENT reveals a normocephalic, atraumatic skull, no scleral icterus, no oral lesions  Neck is supple without any cervical or supraclavicular adenopathy.  Lungs are clear to auscultation bilaterally. There are no wheezes, rales or rhonci Cardiac is regular rate and rhythm with a normal S1 and S2. There are no murmurs, rubs, or bruits.  Abdomen is soft with good bowel sounds, there is no palpable mass. There is no palpable hepatosplenomegaly. There is no palpable fluid wave.  Musculoskeletal no tenderness of the spine, ribs, or hips.  Extremities there are no clubbing, cyanosis, or edema.    Skin no petechia, purpura or ecchymosis Neurologic is nonfocal.  Laboratory Data: White count 6.1, hemoglobin 13.5, hematocrit 39.9, platelets 303,000   Current Outpatient Prescriptions on File Prior to Visit  Medication Sig Dispense Refill  . budesonide (PULMICORT) 0.5 MG/2ML nebulizer solution 2 (two) times daily.       Marland Kitchen buPROPion (WELLBUTRIN SR) 150 MG 12 hr tablet Twice daily.      . Calcium Carbonate-Vitamin D (CALCIUM + D PO) Take by mouth 2 (two) times daily.      Marland Kitchen DEXILANT 60 MG capsule daily.       Marland Kitchen EPIPEN 2-PAK 0.3 MG/0.3ML DEVI as directed.      Marland Kitchen HYDROcodone-acetaminophen (NORCO/VICODIN) 5-325 MG per tablet every 8 (eight) hours as needed.       . hyoscyamine (LEVSIN SL) 0.125 MG SL tablet as needed.       Marland Kitchen levothyroxine (SYNTHROID, LEVOTHROID) 25 MCG tablet daily.       . meloxicam (MOBIC) 15 MG tablet as needed.       . metaxalone (SKELAXIN) 800 MG tablet as directed.      . montelukast (SINGULAIR) 10 MG tablet every morning.      . ondansetron (ZOFRAN-ODT) 4 MG disintegrating tablet as needed.       . promethazine-codeine (PHENERGAN WITH CODEINE) 6.25-10 MG/5ML syrup as directed.      . QNASL 80 MCG/ACT AERS every morning.      . traMADol (ULTRAM) 50 MG tablet as needed.      . traZODone (DESYREL) 50 MG tablet Take 50 mg by mouth at bedtime.      Marland Kitchen  VENTOLIN HFA 108 (90 BASE) MCG/ACT inhaler as needed.      . Vitamin D, Ergocalciferol, (DRISDOL) 50000 UNITS CAPS once a week.       Pauline Aus 1.25 MG/3ML nebulizer solution as needed.       Assessment/Plan: This is a pleasant, 61 year old, female, with the following issues:  #1.  History of iron deficiency anemia. .  She did receive IV iron.  Back on 05/23/2012.  She reports, that she did feel like she had more energy after her infusion.  We will let her know if she needs iron once we get the results back.  We will continue to monitor her iron studies.  #2 followup-Ms. Hargreaves will follow back up with [place] in 2 months,  but before then should she have any questions or concerns.

## 2012-09-05 ENCOUNTER — Ambulatory Visit: Payer: Self-pay | Admitting: Gastroenterology

## 2012-09-25 ENCOUNTER — Ambulatory Visit (HOSPITAL_BASED_OUTPATIENT_CLINIC_OR_DEPARTMENT_OTHER): Payer: 59 | Admitting: Hematology & Oncology

## 2012-09-25 ENCOUNTER — Other Ambulatory Visit (HOSPITAL_BASED_OUTPATIENT_CLINIC_OR_DEPARTMENT_OTHER): Payer: 59 | Admitting: Lab

## 2012-09-25 VITALS — BP 118/60 | HR 62 | Temp 97.8°F | Resp 16 | Ht 62.0 in | Wt 178.0 lb

## 2012-09-25 DIAGNOSIS — D509 Iron deficiency anemia, unspecified: Secondary | ICD-10-CM

## 2012-09-25 DIAGNOSIS — K589 Irritable bowel syndrome without diarrhea: Secondary | ICD-10-CM

## 2012-09-25 LAB — IRON AND TIBC
%SAT: 27 % (ref 20–55)
Iron: 86 ug/dL (ref 42–145)
TIBC: 313 ug/dL (ref 250–470)

## 2012-09-25 LAB — FERRITIN: Ferritin: 252 ng/mL (ref 10–291)

## 2012-09-25 LAB — CBC WITH DIFFERENTIAL (CANCER CENTER ONLY)
BASO#: 0 10*3/uL (ref 0.0–0.2)
Eosinophils Absolute: 0.1 10*3/uL (ref 0.0–0.5)
HGB: 12.8 g/dL (ref 11.6–15.9)
MCV: 91 fL (ref 81–101)
MONO#: 0.5 10*3/uL (ref 0.1–0.9)
NEUT#: 4.1 10*3/uL (ref 1.5–6.5)
Platelets: 305 10*3/uL (ref 145–400)
RBC: 4.24 10*6/uL (ref 3.70–5.32)
WBC: 6.4 10*3/uL (ref 3.9–10.0)

## 2012-09-25 LAB — RETICULOCYTES (CHCC): ABS Retic: 50.6 10*3/uL (ref 19.0–186.0)

## 2012-09-25 MED ORDER — BELLADONNA ALK-PHENOBARBITAL 16.2 MG/5ML PO ELIX: 5.0000 mL | ORAL_SOLUTION | Freq: Four times a day (QID) | ORAL | Status: DC | PRN

## 2012-09-25 NOTE — Progress Notes (Signed)
This office note has been dictated.

## 2012-09-26 ENCOUNTER — Telehealth: Payer: Self-pay | Admitting: Oncology

## 2012-09-26 NOTE — Telephone Encounter (Signed)
Patient calls in requesting iron results.Results given to patient, Dr. Myna Hidalgo consulted on whether iron infusion is necessarry, no infusion warranted. Pt notified. Teola Bradley, Tequila Rottmann [employer]

## 2012-09-26 NOTE — Progress Notes (Signed)
CC:   Tomi Bamberger, NP  DIAGNOSIS:  Recurrent iron-deficiency anemia.  CURRENT THERAPY:  IV iron as indicated.  INTERIM HISTORY:  Ms. Romain comes in for followup.  She has still having issues with abdominal pain.  She has been going to [facility] for this. She has apparently had a bunch of tests, but nothing seems to be identifying the source of the pain.  She has had a fundoplasty because of a hiatal hernia.  She is still having reflux.  She got iron, I think back in August.  She is feeling tired again.  When we last saw her in, I think October, her ferritin was 314 with iron saturation of 28%.  She has not noticed any obvious melena.  There is no weight loss or weight gain.  She has had no rashes.  She has had no cough.  PHYSICAL EXAMINATION:  This is a well-developed, well-nourished white female in no obvious distress.  Vital signs:  Temperature of 97.8, pulse 62, respiratory rate 16, blood pressure 118/60.  Her weight is 178. Head and neck:  Normocephalic, atraumatic skull.  There are no ocular or oral lesions.  There are no palpable cervical or supraclavicular lymph nodes.  Lungs:  Clear to percussion and auscultation bilaterally. Cardiac:  Regular rate and rhythm with a normal S1 and S2.  There are no murmurs, rubs, or bruits.  Abdomen:  Soft with good bowel sounds.  There is no palpable abdominal mass.  She has a laparoscopy scar that is well healed.  There is no fluid wave.  There is no palpable hepatosplenomegaly.  Back:  No tenderness over the spine, ribs, or hips. Extremities:  No clubbing, cyanosis or edema.  Skin:  No rashes, ecchymosis or petechia.  LABORATORIES STUDIES:  White cell count is 6.4, hemoglobin 12.8, hematocrit 38.6, platelet count 305.  IMPRESSION:  Ms. Rochefort is a very nice 61 year old white female with recurrent iron-deficiency anemia.  She is on quite a few medications, which certainly is not helping her iron absorption.  She is on a proton pump  inhibitor, which also is an issue with iron absorption.  We will see what her iron stores are.  Again, we certainly can give her iron.  She responds to it.  I will go ahead and plan to get her back in another 3 months.  I did give her a prescription for Donnatal, take 1 teaspoons 4 times a day as needed.    ______________________________ Josph Macho, M.D. PRE/MEDQ  D:  09/25/2012  T:  09/26/2012  Job:  [id number]

## 2012-11-19 ENCOUNTER — Other Ambulatory Visit: Payer: Self-pay | Admitting: Orthopedic Surgery

## 2012-11-19 DIAGNOSIS — M25511 Pain in right shoulder: Secondary | ICD-10-CM

## 2012-11-25 ENCOUNTER — Other Ambulatory Visit: Payer: 59

## 2012-12-23 ENCOUNTER — Telehealth: Payer: Self-pay | Admitting: Hematology & Oncology

## 2012-12-23 NOTE — Telephone Encounter (Signed)
Patient called and cx 12/24/12 and resch for 01/02/13

## 2012-12-24 ENCOUNTER — Other Ambulatory Visit: Payer: 59 | Admitting: Lab

## 2012-12-24 ENCOUNTER — Ambulatory Visit: Payer: 59 | Admitting: Medical

## 2013-01-02 ENCOUNTER — Other Ambulatory Visit: Payer: 59 | Admitting: Lab

## 2013-01-02 ENCOUNTER — Ambulatory Visit: Payer: 59 | Admitting: Medical

## 2013-02-09 ENCOUNTER — Encounter (INDEPENDENT_AMBULATORY_CARE_PROVIDER_SITE_OTHER): Payer: Self-pay

## 2013-02-11 ENCOUNTER — Telehealth (INDEPENDENT_AMBULATORY_CARE_PROVIDER_SITE_OTHER): Payer: Self-pay

## 2013-02-11 ENCOUNTER — Encounter (INDEPENDENT_AMBULATORY_CARE_PROVIDER_SITE_OTHER): Payer: Self-pay

## 2013-02-11 NOTE — Telephone Encounter (Signed)
LMOV pt reminded to bring all CT's and reports to her appointment with Dr. Abbey Chatters on 02/13/13.

## 2013-02-12 ENCOUNTER — Encounter (INDEPENDENT_AMBULATORY_CARE_PROVIDER_SITE_OTHER): Payer: Self-pay | Admitting: General Surgery

## 2013-02-20 ENCOUNTER — Ambulatory Visit: Payer: Self-pay | Admitting: Gastroenterology

## 2013-02-24 LAB — PATHOLOGY REPORT

## 2013-03-01 IMAGING — RF DG UGI W/ SMALL BOWEL
3 series · 15 of 24 positions shown · non-contrast
Comparison: none

REASON FOR EXAM: gen abd pain
COMMENTS:

[Series 1: run · 10 of 18 slices shown (1 of 2)]
[im 1/18]
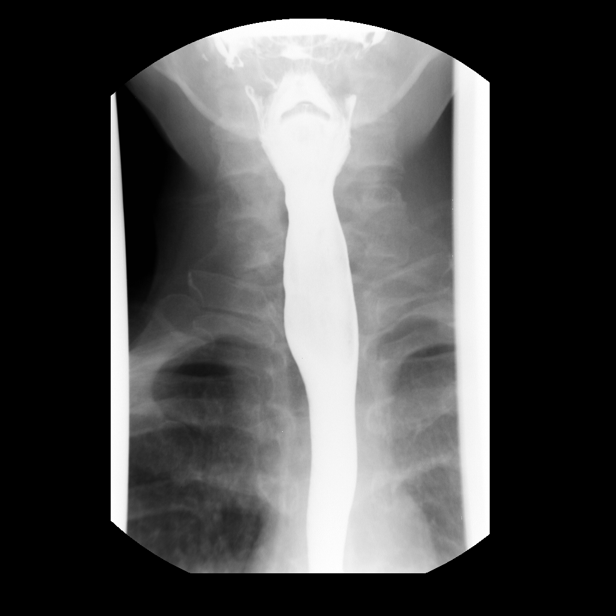
[im 3/18]
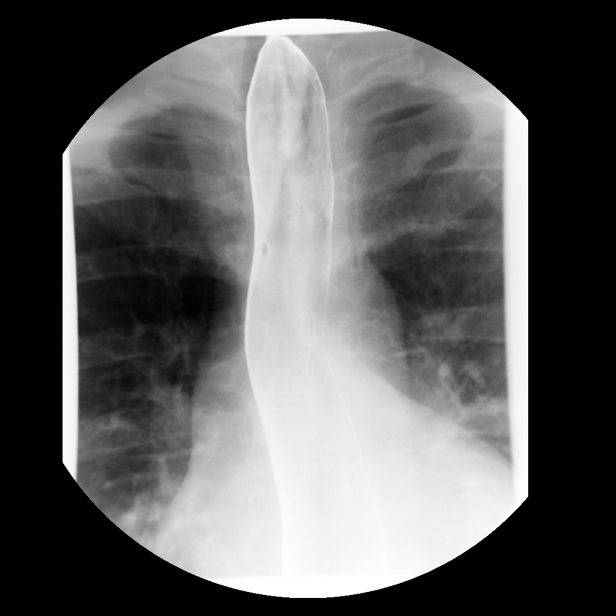
[im 5/18]
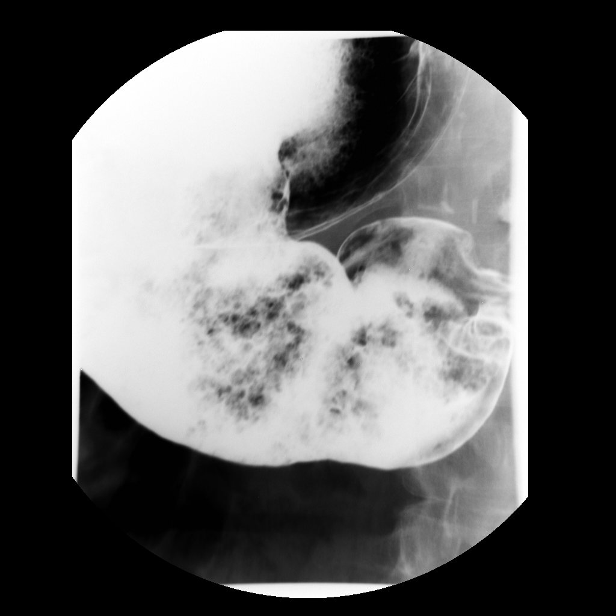
[im 6/18]
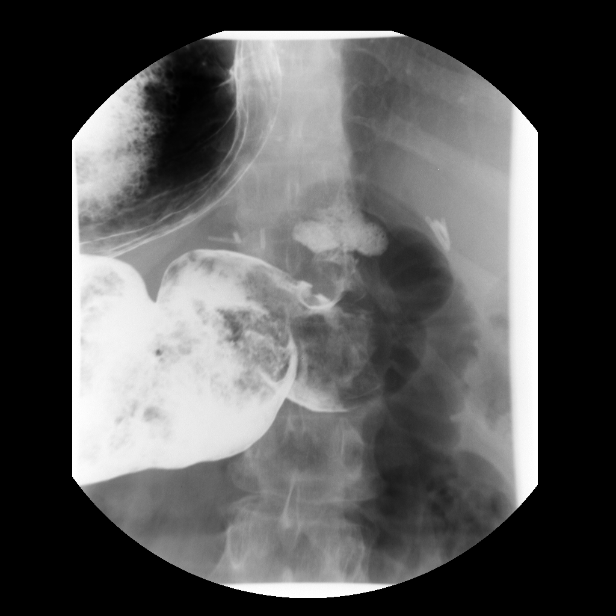
[im 8/18]
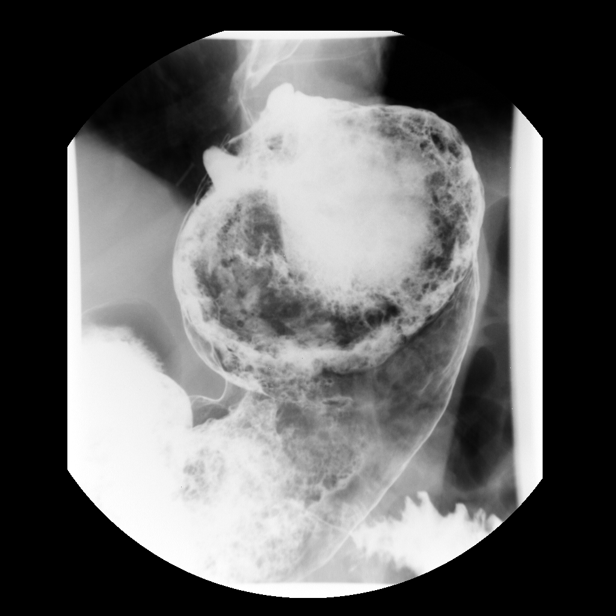
[im 10/18]
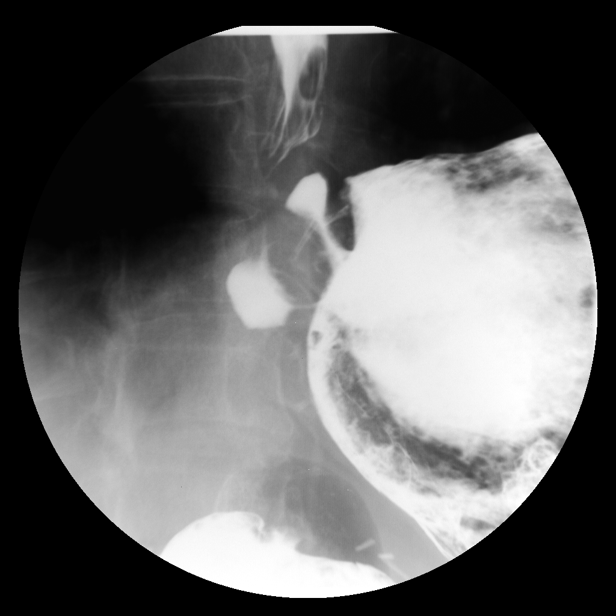
[im 12/18]
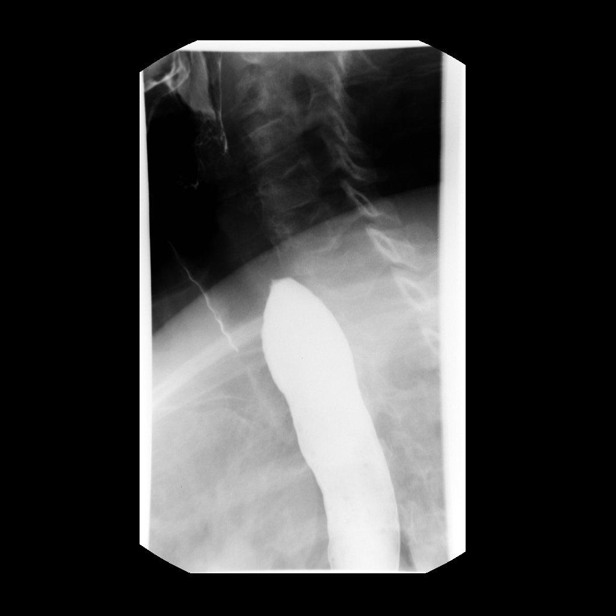
[im 14/18]
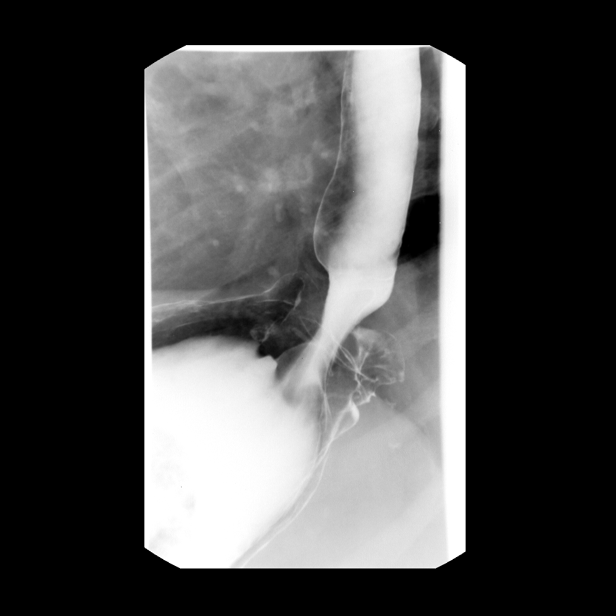
[im 15/18]
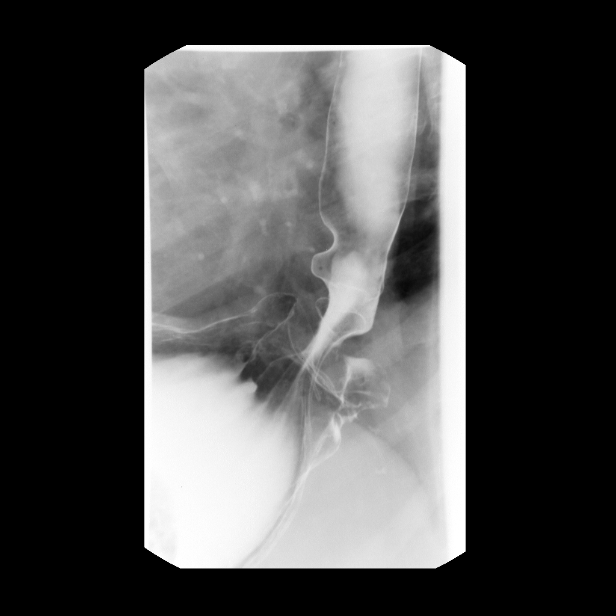
[im 18/18]
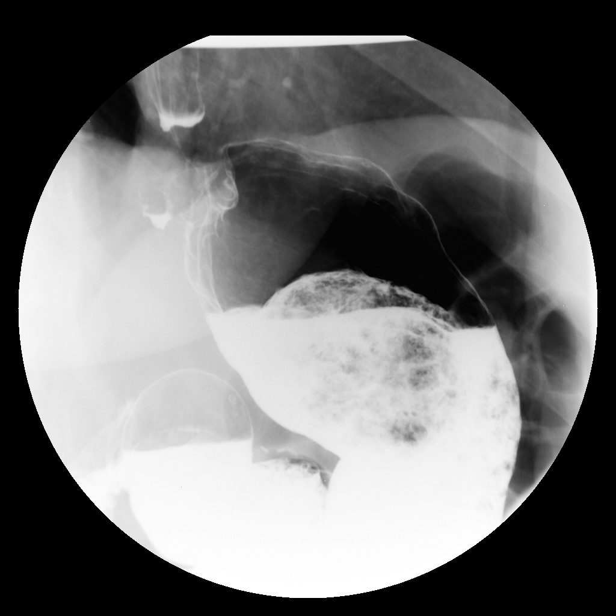

[Series 1: view not recorded · 0.17mm/px · 3 of 6 slices shown]
[im 1/6]
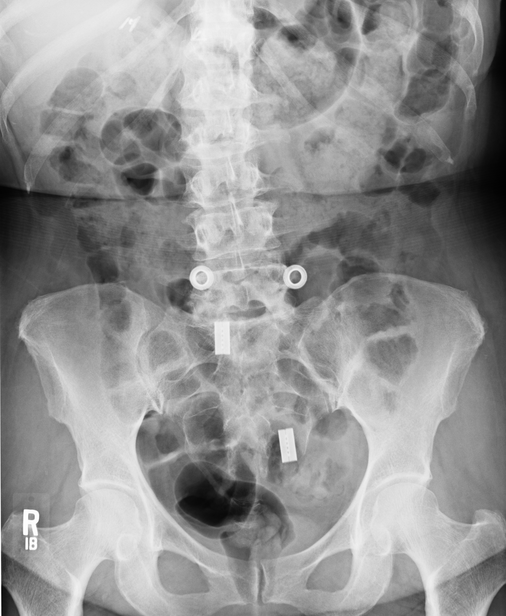
[im 3/6]
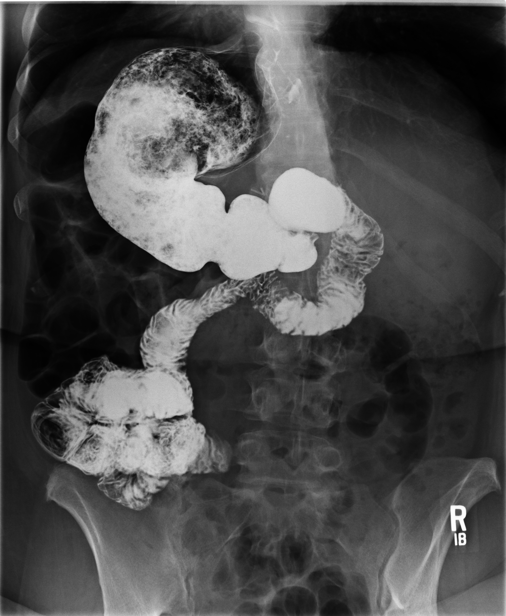
[im 6/6]
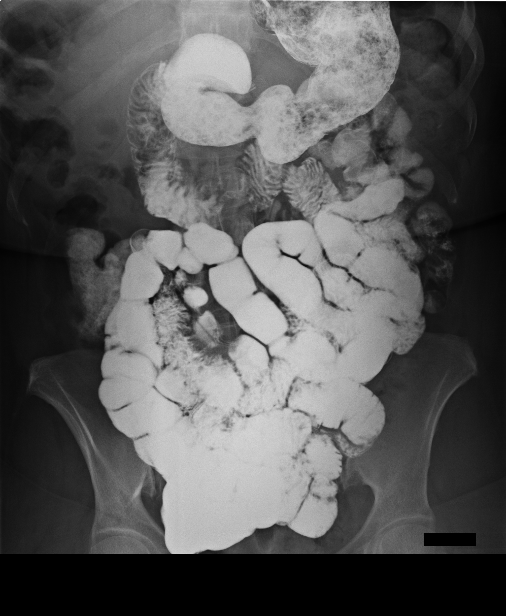

[Series 1: run · 2 of 4 slices shown (2 of 2)]
[im 1/4]
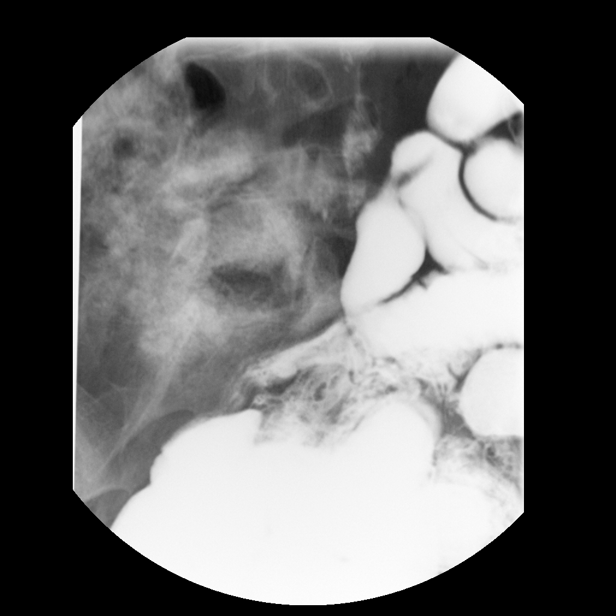
[im 4/4]
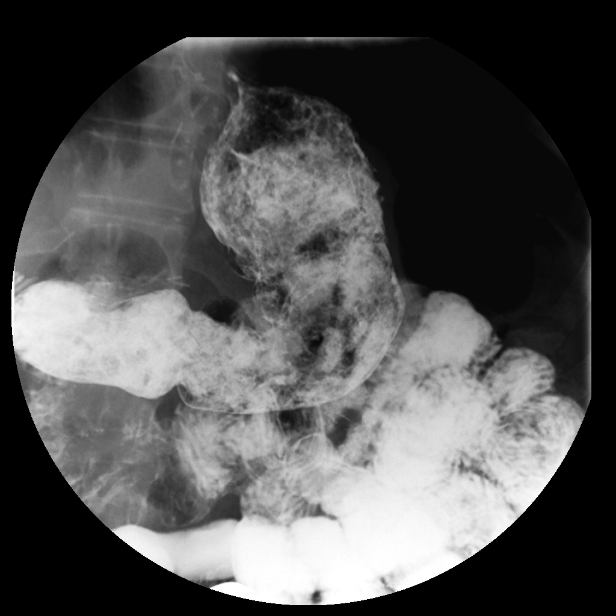

[15 of 24 positions shown; findings below may reference images not displayed]

PROCEDURE:     FL  - FL UPPER GI WITH SMALL BOWEL  - May 24, 2011 [DATE]

RESULT:     Double contrast Upper GI with small bowel follow through shows
the patient easily ingests the liquid barium. The esophagus distends fully.
No definite hiatal hernia is evident. There is some slight deformity of the
gastroesophageal junction. The patient has a history of a Nissen
fundoplication which likely accounts for this appearance. Gastroesophageal
reflux is demonstrated into the distal portion of the esophagus only. There
is a large amount of retained ingested material in the stomach. Gastric
emptying difficulty versus bezoar is differential considerations. No
definite mucosal abnormality is seen. The proximal small bowel is
unremarkable. The barium column reached the colon at approximately 45
minutes after ingestion. The small bowel loops show normal peristalsis and
mucosal pattern. The terminal ileum is unremarkable.
IMPRESSION: 1.  Large amount of retained material within the stomach which could
represent delayed gastric emptying of solids versus bezoar. Endoscopic
correlation is recommended. A solid gastric emptying study may be helpful as
well.
2.  Deformity of the anatomy at the gastroesophageal junction region likely
secondary to Nissen fundoplication. Gastroesophageal reflux was observed
into the mid to distal third of the esophagus. There is no evidence of
aspiration.

## 2013-03-02 ENCOUNTER — Encounter (INDEPENDENT_AMBULATORY_CARE_PROVIDER_SITE_OTHER): Payer: Self-pay | Admitting: General Surgery

## 2013-03-02 ENCOUNTER — Ambulatory Visit (INDEPENDENT_AMBULATORY_CARE_PROVIDER_SITE_OTHER): Payer: 59 | Admitting: General Surgery

## 2013-03-02 VITALS — BP 122/80 | HR 83 | Temp 97.4°F | Resp 17 | Ht 60.25 in | Wt 185.2 lb

## 2013-03-02 DIAGNOSIS — K219 Gastro-esophageal reflux disease without esophagitis: Secondary | ICD-10-CM

## 2013-03-02 NOTE — Progress Notes (Signed)
Patient ID: Tamara Williams, female   DOB: [birth year], 62 y.o.   MRN: [id number]  Chief Complaint  Patient presents with  . New Evaluation    eval epigastric pain    HPI Tamara Williams is a 62 y.o. female.   HPI  She is referred by Dr. Bluford Kaufmann for evaluation of abdominal pain and recurrent GERD.  She underwent laparoscopic repair of a large hiatal hernia with pledgets as well as Nissen fundoplication and gastropexy 07/21/2010. Initially she had some dysphagia but this improved. She states she wasn't having any difficulty with swallowing or acid reflux. She underwent an upper endoscopy about a year ago and since that time she states she's had recurrent reflux. She is taking dexamethasone that is better. She also has some irritable bowel syndrome. She'll get some significant upper abdominal bloating and cramping and fullness and then here a lot of borborygmi. She then has a episode of diarrhea which relieves the pain. She does have some occasional heartburn. She underwent an upper GI with small bowel follow-through which demonstrated what appeared to be a sliding hiatal hernia and a question of whether her wrap had become disrupted. CT scan demonstrated what was felt to be a small hiatal hernia as well. One of her endoscopies demonstrated an intact wrap. She has not lost a significant amount of weight.  Past Medical History  Diagnosis Date  . Constipation   . Nausea and vomiting   . Anemia   . Arthritis   . Asthma   . GERD (gastroesophageal reflux disease)   . Thyroid disease   . Degenerative disc disease   . Chronic sciatica     Past Surgical History  Procedure Laterality Date  . Gastroplasty  07/21/2010  . Abdominal hysterectomy  09/15/1988  . Ovary surgery  07/19/1988    Removal  . Foot surgery  01/03/2010    Family History  Problem Relation Age of Onset  . Cancer Father     Prostate,Lung  . COPD Father   . Leukemia Sister   . Cancer Brother     Prostate  . Cancer Brother      Prostate    Social History History  Substance Use Topics  . Smoking status: Never Smoker   . Smokeless tobacco: Never Used  . Alcohol Use: 1.8 oz/week    3 Glasses of wine per week     Comment: Monthly.    Allergies  Allergen Reactions  . Amoxicillin-Pot Clavulanate     vomiting  . Percocet (Oxycodone-Acetaminophen)     itch  . Tetracyclines & Related     itch    Current Outpatient Prescriptions  Medication Sig Dispense Refill  . budesonide (PULMICORT) 0.5 MG/2ML nebulizer solution 2 (two) times daily.       Marland Kitchen buPROPion (WELLBUTRIN SR) 150 MG 12 hr tablet Take 150 mg by mouth Twice daily.       . Calcium Carbonate-Vitamin D (CALCIUM + D PO) Take by mouth 2 (two) times daily.      Marland Kitchen DEXILANT 60 MG capsule Take 60 mg by mouth daily.       Marland Kitchen EPIPEN 2-PAK 0.3 MG/0.3ML DEVI as directed.      Marland Kitchen HYDROcodone-acetaminophen (NORCO/VICODIN) 5-325 MG per tablet every 8 (eight) hours as needed.       . hyoscyamine (LEVSIN SL) 0.125 MG SL tablet Take 0.125 mg by mouth as needed.       Marland Kitchen levothyroxine (SYNTHROID, LEVOTHROID) 25 MCG tablet Take 25 mcg by  mouth daily.       Marland Kitchen Linaclotide (LINZESS) 145 MCG CAPS Take by mouth daily.      . metaxalone (SKELAXIN) 800 MG tablet Take 800 mg by mouth as directed.       . montelukast (SINGULAIR) 10 MG tablet Take 10 mg by mouth every morning.       . ondansetron (ZOFRAN-ODT) 4 MG disintegrating tablet Take 4 mg by mouth every 12 (twelve) hours as needed.       Illa Level 80 MCG/ACT AERS Place into the nose every morning.       . traMADol (ULTRAM) 50 MG tablet Take 50 mg by mouth every 6 (six) hours as needed.       . traZODone (DESYREL) 50 MG tablet Take 50 mg by mouth at bedtime.      . VENTOLIN HFA 108 (90 BASE) MCG/ACT inhaler Inhale 1 puff into the lungs as needed.       Pauline Aus 1.25 MG/3ML nebulizer solution Take 1.25 mg by nebulization as needed.        No current facility-administered medications for this visit.    Review of  Systems Review of Systems  Constitutional: Negative.   HENT: Positive for sore throat. Negative for trouble swallowing.   Cardiovascular: Negative.   Gastrointestinal: Positive for nausea, abdominal pain, diarrhea, constipation and abdominal distention.    Blood pressure 122/80, pulse 83, temperature 97.4 F (36.3 C), temperature source Temporal, resp. rate 17, height 5' 0.25" (1.53 m), weight 185 lb 3.2 oz (84.006 kg).  Physical Exam Physical Exam  Constitutional: No distress.  Overweight female. Very pleasant and cooperative.  HENT:  Head: Normocephalic and atraumatic.  Mouth/Throat: Oropharynx is clear and moist.  Neck: Neck supple.  Abdominal: Soft. She exhibits no distension and no mass. There is no tenderness.  Multiple small abdominal scars.  Lymphadenopathy:    She has no cervical adenopathy.    Data Reviewed CT scan and upper GI study. Notes from outside physicians.  Assessment    Upper abdominal pain appears to be secondary to irritable bowel syndrome. She does have some recurrent gastroesophageal reflux but when I reviewed the upper GI study it appeared that her wrap was intact. She did have a loose fundoplication because of esophageal dysmotility disorder on her manometry preoperatively in 2011.  I do not think she needs to have a recurrent operation at this time.    Plan    Avoid spicy and fatty foods.  Avoid overeating. Try taking her antireflux medication at night. Use Mylanta when necessary. Sit upright for 60 minutes after meals. Start an exercise regimen and try to lose some weight. Return visit as needed.        Cella Cappello J 03/02/2013, 4:27 PM

## 2013-03-02 NOTE — Patient Instructions (Signed)
Do not overeat. Avoid spicy or fatty foods. Try to avoid caffeine, peppermint, and exposure to nicotine. Stay upright for 60 minutes after eating meals. Take the antireflux medicine at 5 PM. Start the exercise regimen as we discussed.

## 2013-03-13 ENCOUNTER — Other Ambulatory Visit: Payer: Self-pay | Admitting: Gastroenterology

## 2013-03-17 ENCOUNTER — Encounter (INDEPENDENT_AMBULATORY_CARE_PROVIDER_SITE_OTHER): Payer: Self-pay

## 2013-03-17 LAB — STOOL CULTURE

## 2013-05-22 ENCOUNTER — Other Ambulatory Visit: Payer: Self-pay | Admitting: Orthopedic Surgery

## 2013-05-22 DIAGNOSIS — M25572 Pain in left ankle and joints of left foot: Secondary | ICD-10-CM

## 2013-05-24 ENCOUNTER — Ambulatory Visit
Admission: RE | Admit: 2013-05-24 | Discharge: 2013-05-24 | Disposition: A | Discharge status: Home / Self Care | Payer: 59 | Source: Ambulatory Visit | Attending: Orthopedic Surgery | Admitting: Orthopedic Surgery

## 2013-05-24 DIAGNOSIS — M25572 Pain in left ankle and joints of left foot: Secondary | ICD-10-CM

## 2013-06-30 ENCOUNTER — Encounter (INDEPENDENT_AMBULATORY_CARE_PROVIDER_SITE_OTHER): Payer: Self-pay

## 2013-08-04 ENCOUNTER — Ambulatory Visit
Admission: RE | Admit: 2013-08-04 | Discharge: 2013-08-04 | Disposition: A | Discharge status: Home / Self Care | Payer: 59 | Source: Ambulatory Visit | Attending: Nurse Practitioner | Admitting: Nurse Practitioner

## 2013-08-04 ENCOUNTER — Other Ambulatory Visit: Payer: Self-pay | Admitting: Nurse Practitioner

## 2013-08-04 DIAGNOSIS — R1032 Left lower quadrant pain: Secondary | ICD-10-CM

## 2013-08-05 ENCOUNTER — Other Ambulatory Visit: Payer: Self-pay | Admitting: Nurse Practitioner

## 2013-08-05 ENCOUNTER — Ambulatory Visit
Admission: RE | Admit: 2013-08-05 | Discharge: 2013-08-05 | Disposition: A | Discharge status: Home / Self Care | Payer: 59 | Source: Ambulatory Visit | Attending: Nurse Practitioner | Admitting: Nurse Practitioner

## 2013-08-05 DIAGNOSIS — K589 Irritable bowel syndrome without diarrhea: Secondary | ICD-10-CM

## 2013-08-13 ENCOUNTER — Ambulatory Visit: Payer: Self-pay | Admitting: Gastroenterology

## 2013-10-01 HISTORY — PX: KNEE ARTHROSCOPY: SUR90

## 2013-12-14 ENCOUNTER — Other Ambulatory Visit: Payer: Self-pay | Admitting: Nurse Practitioner

## 2013-12-14 ENCOUNTER — Ambulatory Visit
Admission: RE | Admit: 2013-12-14 | Discharge: 2013-12-14 | Disposition: A | Discharge status: Home / Self Care | Payer: 59 | Source: Ambulatory Visit | Attending: Nurse Practitioner | Admitting: Nurse Practitioner

## 2013-12-14 DIAGNOSIS — R52 Pain, unspecified: Secondary | ICD-10-CM

## 2013-12-14 DIAGNOSIS — T1490XA Injury, unspecified, initial encounter: Secondary | ICD-10-CM

## 2014-04-14 ENCOUNTER — Other Ambulatory Visit: Payer: Self-pay | Admitting: Orthopedic Surgery

## 2014-04-14 DIAGNOSIS — M25561 Pain in right knee: Secondary | ICD-10-CM

## 2014-04-18 ENCOUNTER — Ambulatory Visit
Admission: RE | Admit: 2014-04-18 | Discharge: 2014-04-18 | Disposition: A | Discharge status: Home / Self Care | Payer: 59 | Source: Ambulatory Visit | Attending: Orthopedic Surgery | Admitting: Orthopedic Surgery

## 2014-04-18 DIAGNOSIS — M25561 Pain in right knee: Secondary | ICD-10-CM

## 2014-10-31 ENCOUNTER — Encounter (HOSPITAL_COMMUNITY): Payer: Self-pay

## 2014-10-31 ENCOUNTER — Emergency Department (HOSPITAL_COMMUNITY)
Admission: EM | Admit: 2014-10-31 | Discharge: 2014-10-31 | Disposition: A | Discharge status: Home / Self Care | Payer: 59 | Source: Home / Self Care | Attending: Family Medicine | Admitting: Family Medicine

## 2014-10-31 DIAGNOSIS — J40 Bronchitis, not specified as acute or chronic: Secondary | ICD-10-CM

## 2014-10-31 MED ORDER — AZITHROMYCIN 250 MG PO TABS: 250.0000 mg | ORAL_TABLET | Freq: Every day | ORAL | Status: DC

## 2014-10-31 MED ORDER — GUAIFENESIN-CODEINE 100-10 MG/5ML PO SOLN: 5.0000 mL | Freq: Every evening | ORAL | Status: DC | PRN

## 2014-10-31 MED ORDER — IPRATROPIUM BROMIDE 0.06 % NA SOLN: 2.0000 | Freq: Four times a day (QID) | NASAL | Status: DC

## 2014-10-31 MED ORDER — IPRATROPIUM-ALBUTEROL 0.5-2.5 (3) MG/3ML IN SOLN: 3.0000 mL | Freq: Four times a day (QID) | RESPIRATORY_TRACT | Status: DC | PRN

## 2014-10-31 MED ORDER — PREDNISONE 10 MG PO TABS: 30.0000 mg | ORAL_TABLET | Freq: Every day | ORAL | Status: DC

## 2014-10-31 NOTE — ED Provider Notes (Signed)
Tamara Williams is a 64 y.o. female who presents to Urgent Care today for cough and congestion. Symptoms present for 3 days worsening recently. Patient also has headache and left ear pain. No fevers chills nausea vomiting or diarrhea. She has used her albuterol inhaler and DuoNeb nebulizer which helps only temporarily. She suspects that she has bronchitis. She used her inhaler and nebulizer this morning and currently is breathing well but notes that she will get sick again this afternoon.     Past Medical History  Diagnosis Date  . Constipation   . Nausea and vomiting   . Anemia   . Arthritis   . Asthma   . GERD (gastroesophageal reflux disease)   . Thyroid disease   . Degenerative disc disease   . Chronic sciatica    Past Surgical History  Procedure Laterality Date  . Gastroplasty  07/21/2010  . Abdominal hysterectomy  09/15/1988  . Ovary surgery  07/19/1988    Removal  . Foot surgery  01/03/2010   History  Substance Use Topics  . Smoking status: Never Smoker   . Smokeless tobacco: Never Used  . Alcohol Use: 1.8 oz/week    3 Glasses of wine per week     Comment: Monthly.   ROS as above Medications: No current facility-administered medications for this encounter.   Current Outpatient Prescriptions  Medication Sig Dispense Refill  . azithromycin (ZITHROMAX) 250 MG tablet Take 1 tablet (250 mg total) by mouth daily. Take first 2 tablets together, then 1 every day until finished. 6 tablet 0  . budesonide (PULMICORT) 0.5 MG/2ML nebulizer solution 2 (two) times daily.     Marland Kitchen buPROPion (WELLBUTRIN SR) 150 MG 12 hr tablet Take 150 mg by mouth Twice daily.     . Calcium Carbonate-Vitamin D (CALCIUM + D PO) Take by mouth 2 (two) times daily.    Marland Kitchen DEXILANT 60 MG capsule Take 60 mg by mouth daily.     Marland Kitchen EPIPEN 2-PAK 0.3 MG/0.3ML DEVI as directed.    Marland Kitchen guaiFENesin-codeine 100-10 MG/5ML syrup Take 5 mLs by mouth at bedtime as needed for cough. 120 mL 0  . HYDROcodone-acetaminophen  (NORCO/VICODIN) 5-325 MG per tablet every 8 (eight) hours as needed.     . hyoscyamine (LEVSIN SL) 0.125 MG SL tablet Take 0.125 mg by mouth as needed.     Marland Kitchen ipratropium (ATROVENT) 0.06 % nasal spray Place 2 sprays into both nostrils 4 (four) times daily. 15 mL 1  . ipratropium-albuterol (DUONEB) 0.5-2.5 (3) MG/3ML SOLN Take 3 mLs by nebulization every 6 (six) hours as needed. 360 mL 1  . levothyroxine (SYNTHROID, LEVOTHROID) 25 MCG tablet Take 25 mcg by mouth daily.     Marland Kitchen Linaclotide (LINZESS) 145 MCG CAPS Take by mouth daily.    . metaxalone (SKELAXIN) 800 MG tablet Take 800 mg by mouth as directed.     . montelukast (SINGULAIR) 10 MG tablet Take 10 mg by mouth every morning.     . ondansetron (ZOFRAN-ODT) 4 MG disintegrating tablet Take 4 mg by mouth every 12 (twelve) hours as needed.     . predniSONE (DELTASONE) 10 MG tablet Take 3 tablets (30 mg total) by mouth daily. 15 tablet 0  . QNASL 80 MCG/ACT AERS Place into the nose every morning.     . traMADol (ULTRAM) 50 MG tablet Take 50 mg by mouth every 6 (six) hours as needed.     . traZODone (DESYREL) 50 MG tablet Take 50 mg by mouth at  bedtime.    . VENTOLIN HFA 108 (90 BASE) MCG/ACT inhaler Inhale 1 puff into the lungs as needed.     Penne Lash 1.25 MG/3ML nebulizer solution Take 1.25 mg by nebulization as needed.      Allergies  Allergen Reactions  . Amoxicillin-Pot Clavulanate     vomiting  . Percocet [Oxycodone-Acetaminophen]     itch  . Tetracyclines & Related     itch     Exam:  BP 111/72 mmHg  Pulse 74  Temp(Src) 98.3 F (36.8 C) (Oral)  Resp 16  SpO2 97% Gen: Well NAD HEENT: EOMI,  MMM normal tympanic membranes bilaterally. Posterior pharynx cobblestoning. Maxillary sinuses are nontender. Lungs: Normal work of breathing. Slight wheeze bilaterally Heart: RRR no MRG Abd: NABS, Soft. Nondistended, Nontender Exts: Brisk capillary refill, warm and well perfused.   No results found for this or any previous visit (from  the past 24 hour(s)). No results found.  Assessment and Plan: 64 y.o. female with bronchitis. Treat with prednisone DuoNeb treatment as at home, azithromycin, codeine cough syrup, Atrovent nasal spray. Continue albuterol as needed. Return as needed.  Discussed warning signs or symptoms. Please see discharge instructions. Patient expresses understanding.     Gregor Hams, MD 10/31/14 [phone]

## 2014-10-31 NOTE — Discharge Instructions (Signed)
Thank you for coming in today. Call or go to the emergency room if you get worse, have trouble breathing, have chest pains, or palpitations.  Use albuterol frequently. Do not drive after taking codeine  Acute Bronchitis Bronchitis is inflammation of the airways that extend from the windpipe into the lungs (bronchi). The inflammation often causes mucus to develop. This leads to a cough, which is the most common symptom of bronchitis.  In acute bronchitis, the condition usually develops suddenly and goes away over time, usually in a couple weeks. Smoking, allergies, and asthma can make bronchitis worse. Repeated episodes of bronchitis may cause further lung problems.  CAUSES Acute bronchitis is most often caused by the same virus that causes a cold. The virus can spread from person to person (contagious) through coughing, sneezing, and touching contaminated objects. SIGNS AND SYMPTOMS   Cough.   Fever.   Coughing up mucus.   Body aches.   Chest congestion.   Chills.   Shortness of breath.   Sore throat.  DIAGNOSIS  Acute bronchitis is usually diagnosed through a physical exam. Your health care provider will also ask you questions about your medical history. Tests, such as chest X-rays, are sometimes done to rule out other conditions.  TREATMENT  Acute bronchitis usually goes away in a couple weeks. Oftentimes, no medical treatment is necessary. Medicines are sometimes given for relief of fever or cough. Antibiotic medicines are usually not needed but may be prescribed in certain situations. In some cases, an inhaler may be recommended to help reduce shortness of breath and control the cough. A cool mist vaporizer may also be used to help thin bronchial secretions and make it easier to clear the chest.  HOME CARE INSTRUCTIONS  Get plenty of rest.   Drink enough fluids to keep your urine clear or pale yellow (unless you have a medical condition that requires fluid  restriction). Increasing fluids may help thin your respiratory secretions (sputum) and reduce chest congestion, and it will prevent dehydration.   Take medicines only as directed by your health care provider.  If you were prescribed an antibiotic medicine, finish it all even if you start to feel better.  Avoid smoking and secondhand smoke. Exposure to cigarette smoke or irritating chemicals will make bronchitis worse. If you are a smoker, consider using nicotine gum or skin patches to help control withdrawal symptoms. Quitting smoking will help your lungs heal faster.   Reduce the chances of another bout of acute bronchitis by washing your hands frequently, avoiding people with cold symptoms, and trying not to touch your hands to your mouth, nose, or eyes.   Keep all follow-up visits as directed by your health care provider.  SEEK MEDICAL CARE IF: Your symptoms do not improve after 1 week of treatment.  SEEK IMMEDIATE MEDICAL CARE IF:  You develop an increased fever or chills.   You have chest pain.   You have severe shortness of breath.  You have bloody sputum.   You develop dehydration.  You faint or repeatedly feel like you are going to pass out.  You develop repeated vomiting.  You develop a severe headache. MAKE SURE YOU:   Understand these instructions.  Will watch your condition.  Will get help right away if you are not doing well or get worse. Document Released: 10/25/2004 Document Revised: 02/01/2014 Document Reviewed: 03/10/2013 [facility] Patient Information 2015 [place], [place]. This information is not intended to replace advice given to you by your health care provider. Make  sure you discuss any questions you have with your health care provider.

## 2014-10-31 NOTE — ED Notes (Signed)
Patient complains of cough congestion Sinus pain and increased mucous Symptoms started on wed and have gotten progressively worse Today complains of left ear pain as well Did a nebulizer treatment at 5am today and used her inhaler at 8 am before Coming to the urgent care

## 2014-11-05 ENCOUNTER — Emergency Department (INDEPENDENT_AMBULATORY_CARE_PROVIDER_SITE_OTHER): Payer: 59

## 2014-11-05 ENCOUNTER — Encounter (HOSPITAL_COMMUNITY): Payer: Self-pay | Admitting: Emergency Medicine

## 2014-11-05 ENCOUNTER — Emergency Department (HOSPITAL_COMMUNITY)
Admission: EM | Admit: 2014-11-05 | Discharge: 2014-11-05 | Disposition: A | Discharge status: Home / Self Care | Payer: 59 | Source: Home / Self Care | Attending: Family Medicine | Admitting: Family Medicine

## 2014-11-05 DIAGNOSIS — J0101 Acute recurrent maxillary sinusitis: Secondary | ICD-10-CM | POA: Diagnosis not present

## 2014-11-05 DIAGNOSIS — J45909 Unspecified asthma, uncomplicated: Secondary | ICD-10-CM

## 2014-11-05 MED ORDER — METHYLPREDNISOLONE SODIUM SUCC 125 MG IJ SOLR
125.0000 mg | Freq: Once | INTRAMUSCULAR | Status: AC
  Administered 2014-11-05: 125 mg via INTRAMUSCULAR

## 2014-11-05 MED ORDER — IPRATROPIUM BROMIDE 0.02 % IN SOLN
0.5000 mg | Freq: Once | RESPIRATORY_TRACT | Status: AC
  Administered 2014-11-05: 0.5 mg via RESPIRATORY_TRACT

## 2014-11-05 MED ORDER — METHYLPREDNISOLONE SODIUM SUCC 125 MG IJ SOLR
INTRAMUSCULAR | Status: AC
  Filled 2014-11-05: qty 2

## 2014-11-05 MED ORDER — ALBUTEROL SULFATE (2.5 MG/3ML) 0.083% IN NEBU
5.0000 mg | INHALATION_SOLUTION | Freq: Once | RESPIRATORY_TRACT | Status: AC
  Administered 2014-11-05: 5 mg via RESPIRATORY_TRACT

## 2014-11-05 MED ORDER — ALBUTEROL SULFATE (2.5 MG/3ML) 0.083% IN NEBU
INHALATION_SOLUTION | RESPIRATORY_TRACT | Status: AC
  Filled 2014-11-05: qty 3

## 2014-11-05 MED ORDER — IPRATROPIUM BROMIDE 0.02 % IN SOLN
RESPIRATORY_TRACT | Status: AC
  Filled 2014-11-05: qty 2.5

## 2014-11-05 MED ORDER — IPRATROPIUM BROMIDE 0.06 % NA SOLN
2.0000 | Freq: Four times a day (QID) | NASAL | Status: DC
Start: 2014-11-05 — End: 2017-05-20

## 2014-11-05 MED ORDER — LEVOFLOXACIN 500 MG PO TABS: 500.0000 mg | ORAL_TABLET | Freq: Every day | ORAL | Status: DC

## 2014-11-05 NOTE — Discharge Instructions (Signed)
Take all of medicine, drink lots of fluids, use mucinex as needed, see your doctor if further problems

## 2014-11-05 NOTE — ED Provider Notes (Signed)
CSN: [id number]     Arrival date & time 11/05/14  0802 History   First MD Initiated Contact with Patient 11/05/14 0831     Chief Complaint  Patient presents with  . Headache  . Otalgia  . Cough   (Consider location/radiation/quality/duration/timing/severity/associated sxs/prior Treatment) Patient is a 64 y.o. female presenting with cough. The history is provided by the patient.  Cough Cough characteristics:  Productive Sputum characteristics:  Green Severity:  Moderate Onset quality:  Gradual Duration:  10 days Progression:  Unchanged Chronicity:  New Smoker: no   Context comment:  Seen 1/31 given meds but no change in sx. Ineffective treatments:  Cough suppressants Associated symptoms: ear pain, rhinorrhea and wheezing   Associated symptoms: no fever     Past Medical History  Diagnosis Date  . Constipation   . Nausea and vomiting   . Anemia   . Arthritis   . Asthma   . GERD (gastroesophageal reflux disease)   . Thyroid disease   . Degenerative disc disease   . Chronic sciatica    Past Surgical History  Procedure Laterality Date  . Gastroplasty  07/21/2010  . Abdominal hysterectomy  09/15/1988  . Ovary surgery  07/19/1988    Removal  . Foot surgery  01/03/2010   Family History  Problem Relation Age of Onset  . Cancer Father     Prostate,Lung  . COPD Father   . Leukemia Sister   . Cancer Brother     Prostate  . Cancer Brother     Prostate   History  Substance Use Topics  . Smoking status: Never Smoker   . Smokeless tobacco: Never Used  . Alcohol Use: 1.8 oz/week    3 Glasses of wine per week     Comment: Monthly.   OB History    No data available     Review of Systems  Constitutional: Negative.  Negative for fever.  HENT: Positive for ear pain and rhinorrhea.   Respiratory: Positive for cough and wheezing.     Allergies  Amoxicillin-pot clavulanate; Percocet; and Tetracyclines & related  Home Medications   Prior to Admission medications    Medication Sig Start Date End Date Taking? Authorizing Provider  azithromycin (ZITHROMAX) 250 MG tablet Take 1 tablet (250 mg total) by mouth daily. Take first 2 tablets together, then 1 every day until finished. 10/31/14  Yes Gregor Hams, MD  budesonide (PULMICORT) 0.5 MG/2ML nebulizer solution 2 (two) times daily.  10/22/11  Yes Historical Provider, MD  buPROPion (WELLBUTRIN SR) 150 MG 12 hr tablet Take 150 mg by mouth Twice daily.  10/18/11  Yes Historical Provider, MD  Calcium Carbonate-Vitamin D (CALCIUM + D PO) Take by mouth 2 (two) times daily.   Yes Historical Provider, MD  DEXILANT 60 MG capsule Take 60 mg by mouth daily.  04/23/12  Yes Historical Provider, MD  EPIPEN 2-PAK 0.3 MG/0.3ML DEVI as directed. 10/29/11  Yes Historical Provider, MD  guaiFENesin-codeine 100-10 MG/5ML syrup Take 5 mLs by mouth at bedtime as needed for cough. 10/31/14  Yes Gregor Hams, MD  HYDROcodone-acetaminophen (NORCO/VICODIN) 5-325 MG per tablet every 8 (eight) hours as needed.  03/10/12  Yes Historical Provider, MD  hyoscyamine (LEVSIN SL) 0.125 MG SL tablet Take 0.125 mg by mouth as needed.  04/17/12  Yes Historical Provider, MD  ipratropium-albuterol (DUONEB) 0.5-2.5 (3) MG/3ML SOLN Take 3 mLs by nebulization every 6 (six) hours as needed. 10/31/14  Yes Gregor Hams, MD  levothyroxine (SYNTHROID, [place])  25 MCG tablet Take 25 mcg by mouth daily.  04/23/12  Yes Historical Provider, MD  Linaclotide Rolan Lipa) 145 MCG CAPS Take by mouth daily.   Yes Historical Provider, MD  metaxalone (SKELAXIN) 800 MG tablet Take 800 mg by mouth as directed.  12/25/11  Yes Historical Provider, MD  montelukast (SINGULAIR) 10 MG tablet Take 10 mg by mouth every morning.  12/28/11  Yes Historical Provider, MD  ondansetron (ZOFRAN-ODT) 4 MG disintegrating tablet Take 4 mg by mouth every 12 (twelve) hours as needed.  03/03/12  Yes Historical Provider, MD  predniSONE (DELTASONE) 10 MG tablet Take 3 tablets (30 mg total) by mouth daily.  10/31/14  Yes Gregor Hams, MD  QNASL 80 MCG/ACT AERS Place into the nose every morning.  11/20/11  Yes Historical Provider, MD  traMADol (ULTRAM) 50 MG tablet Take 50 mg by mouth every 6 (six) hours as needed.  01/08/12  Yes Historical Provider, MD  traZODone (DESYREL) 50 MG tablet Take 50 mg by mouth at bedtime.   Yes Historical Provider, MD  VENTOLIN HFA 108 (90 BASE) MCG/ACT inhaler Inhale 1 puff into the lungs as needed.  10/22/11  Yes Historical Provider, MD  XOPENEX 1.25 MG/3ML nebulizer solution Take 1.25 mg by nebulization as needed.  10/22/11  Yes Historical Provider, MD  ipratropium (ATROVENT) 0.06 % nasal spray Place 2 sprays into both nostrils 4 (four) times daily. 11/05/14   Billy Fischer, MD  levofloxacin (LEVAQUIN) 500 MG tablet Take 1 tablet (500 mg total) by mouth daily. 11/05/14   Billy Fischer, MD   BP 138/81 mmHg  Pulse 86  Temp(Src) 98.1 F (36.7 C) (Oral)  Resp 18  SpO2 98% Physical Exam  Constitutional: She is oriented to person, place, and time. She appears well-developed and well-nourished.  HENT:  Right Ear: External ear normal.  Left Ear: External ear normal.  Mouth/Throat: Oropharynx is clear and moist.  Eyes: Pupils are equal, round, and reactive to light.  Neck: Normal range of motion. Neck supple.  Cardiovascular: Normal rate, regular rhythm, normal heart sounds and intact distal pulses.   Pulmonary/Chest: She has wheezes.  Lymphadenopathy:    She has no cervical adenopathy.  Neurological: She is alert and oriented to person, place, and time.  Skin: Skin is warm and dry.  Nursing note and vitals reviewed.   ED Course  Procedures (including critical care time) Labs Review Labs Reviewed - No data to display  Imaging Review No results found.   MDM   1. Acute recurrent maxillary sinusitis   2. Asthmatic bronchitis without complication        Billy Fischer, MD 11/08/14 2008

## 2014-11-05 NOTE — ED Notes (Signed)
Pt states she has been taking the medications prescribed to her on Sunday, but she is not feeling any better.  The only thing that has improved is her throat.  Her sinuses are still draining and she still has a headache and earache.  She also says the cough medicine is not helping her at night.

## 2014-11-29 ENCOUNTER — Other Ambulatory Visit: Payer: Self-pay | Admitting: Nurse Practitioner

## 2014-11-29 ENCOUNTER — Ambulatory Visit
Admission: RE | Admit: 2014-11-29 | Discharge: 2014-11-29 | Disposition: A | Discharge status: Home / Self Care | Payer: 59 | Source: Ambulatory Visit | Attending: Nurse Practitioner | Admitting: Nurse Practitioner

## 2014-11-29 DIAGNOSIS — R339 Retention of urine, unspecified: Secondary | ICD-10-CM

## 2015-09-15 ENCOUNTER — Other Ambulatory Visit: Payer: Self-pay | Admitting: Orthopedic Surgery

## 2015-09-15 DIAGNOSIS — M5416 Radiculopathy, lumbar region: Secondary | ICD-10-CM

## 2015-09-22 ENCOUNTER — Ambulatory Visit
Admission: RE | Admit: 2015-09-22 | Discharge: 2015-09-22 | Disposition: A | Discharge status: Home / Self Care | Payer: 59 | Source: Ambulatory Visit | Attending: Orthopedic Surgery | Admitting: Orthopedic Surgery

## 2015-09-22 DIAGNOSIS — M5416 Radiculopathy, lumbar region: Secondary | ICD-10-CM

## 2015-11-07 ENCOUNTER — Ambulatory Visit
Admission: RE | Admit: 2015-11-07 | Discharge: 2015-11-07 | Disposition: A | Discharge status: Home / Self Care | Payer: 59 | Source: Ambulatory Visit | Attending: Nurse Practitioner | Admitting: Nurse Practitioner

## 2015-11-07 ENCOUNTER — Other Ambulatory Visit: Payer: Self-pay | Admitting: Nurse Practitioner

## 2015-11-07 DIAGNOSIS — R059 Cough, unspecified: Secondary | ICD-10-CM

## 2015-11-07 DIAGNOSIS — R05 Cough: Secondary | ICD-10-CM

## 2015-12-16 ENCOUNTER — Ambulatory Visit
Admission: RE | Admit: 2015-12-16 | Discharge: 2015-12-16 | Disposition: A | Discharge status: Home / Self Care | Payer: 59 | Source: Ambulatory Visit | Attending: Nurse Practitioner | Admitting: Nurse Practitioner

## 2015-12-16 ENCOUNTER — Other Ambulatory Visit: Payer: Self-pay | Admitting: Nurse Practitioner

## 2015-12-16 DIAGNOSIS — K581 Irritable bowel syndrome with constipation: Secondary | ICD-10-CM

## 2015-12-16 DIAGNOSIS — K5792 Diverticulitis of intestine, part unspecified, without perforation or abscess without bleeding: Secondary | ICD-10-CM | POA: Diagnosis not present

## 2015-12-16 DIAGNOSIS — R1084 Generalized abdominal pain: Secondary | ICD-10-CM

## 2015-12-16 DIAGNOSIS — Z8719 Personal history of other diseases of the digestive system: Secondary | ICD-10-CM | POA: Insufficient documentation

## 2015-12-16 MED ORDER — IOPAMIDOL (ISOVUE-300) INJECTION 61%
100.0000 mL | Freq: Once | INTRAVENOUS | Status: AC | PRN
  Administered 2015-12-16: 100 mL via INTRAVENOUS

## 2015-12-19 ENCOUNTER — Ambulatory Visit: Payer: 59

## 2016-02-13 ENCOUNTER — Ambulatory Visit: Admission: RE | Admit: 2016-02-13 | Payer: 59 | Source: Ambulatory Visit | Admitting: Gastroenterology

## 2016-02-13 ENCOUNTER — Encounter: Admission: RE | Payer: Self-pay | Source: Ambulatory Visit

## 2016-02-13 SURGERY — COLONOSCOPY WITH PROPOFOL
Anesthesia: General

## 2016-02-15 ENCOUNTER — Encounter: Payer: Self-pay | Admitting: *Deleted

## 2016-02-15 ENCOUNTER — Encounter: Payer: Self-pay | Admitting: Anesthesiology

## 2016-02-21 ENCOUNTER — Ambulatory Visit: Admission: RE | Admit: 2016-02-21 | Payer: 59 | Source: Ambulatory Visit | Admitting: Gastroenterology

## 2016-02-21 HISTORY — DX: Dizziness and giddiness: R42

## 2016-02-21 SURGERY — COLONOSCOPY
Anesthesia: Choice

## 2016-02-28 ENCOUNTER — Other Ambulatory Visit: Payer: Self-pay | Admitting: Orthopedic Surgery

## 2016-02-28 DIAGNOSIS — M25561 Pain in right knee: Secondary | ICD-10-CM

## 2016-02-29 ENCOUNTER — Ambulatory Visit
Admission: RE | Admit: 2016-02-29 | Discharge: 2016-02-29 | Disposition: A | Discharge status: Home / Self Care | Payer: 59 | Source: Ambulatory Visit | Attending: Orthopedic Surgery | Admitting: Orthopedic Surgery

## 2016-02-29 DIAGNOSIS — M25561 Pain in right knee: Secondary | ICD-10-CM

## 2016-03-01 ENCOUNTER — Other Ambulatory Visit: Payer: 59

## 2016-03-06 ENCOUNTER — Encounter (HOSPITAL_BASED_OUTPATIENT_CLINIC_OR_DEPARTMENT_OTHER): Payer: Self-pay | Admitting: *Deleted

## 2016-03-06 ENCOUNTER — Other Ambulatory Visit: Payer: Self-pay | Admitting: Physician Assistant

## 2016-03-06 NOTE — H&P (Signed)
Tamara Williams comes in for follow up of her wrist, but actually earlier than scheduled because she had a new injury, right knee.  Getting up in a tall truck, flexion twisting injury, right knee.  Marked pain and felt like the knee locked.  She is able to unlock this and get her leg down, but she is having a lot of difficulty with weight bearing.  This is the same side that I did an arthroscopy on back in 2015.  Complex tearing lateral meniscus debrided.  Some Grade II and III changes throughout.  Medial meniscus was not torn.  She did well with that until this recent event.  She comes in for evaluation.   In regards to her right wrist, she is using her splint.  This is still staying sore radially.  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   Remaining history and general exam is reviewed.   EXAMINATION: Lungs clear to auscultation bilaterally.  Heart sounds normal.  Specifically, right wrist still tender right over her scaphoid.  A lot of bruising resolving.  Comfortable limited motion.  Neurovascularly intact. Left knee bone on bone tibiofemoral and patellofemoral crepitus.  Not a lot of pain.  Varus alignment.  5 degrees of varus thrust, further flexion 100 degrees.  Right knee exquisitely tender medial greater than lateral joint line.  Does not like flexion or extension.  Ligaments grossly stable.  Trace effusion.  Neurovascularly intact distally.    X-RAYS: Follow up x-rays of both knees, four views, bone on bone medial compartment, left.  Tricompartmental changes.  The right knee still has relatively well preserved compartments.  A little narrow medially, but not marked.  Three view x-ray of her wrist shows what I think is probably a middle third scaphoid fracture, non-displaced.    DISPOSITION:  1. In regards to her wrist, continue her splint until that is healed.   2. Left knee end stage arthritis, but not enough symptoms to warrant treatment now.  Eventual  total knee replacement.   3. Right knee new twisting injury.  Very concerned about meniscus tear.  MRI.  Follow up with me when that is complete, tailoring further treatment plans depending on what that shows.  She understands and agrees.    Plan: MRI right knee reveals medial meniscus tear.  We are going to go ahead and proceed with right knee arthroscopy with partial medial meniscectomy and chondroplasty.  Risks, benefits and possible complications reviewed.  Rehab and recovery time discussed.  All questions answered.

## 2016-03-07 ENCOUNTER — Other Ambulatory Visit: Payer: 59

## 2016-03-08 ENCOUNTER — Encounter (HOSPITAL_BASED_OUTPATIENT_CLINIC_OR_DEPARTMENT_OTHER)
Admission: RE | Disposition: A | Discharge status: Home / Self Care | Payer: Self-pay | Source: Ambulatory Visit | Attending: Orthopedic Surgery

## 2016-03-08 ENCOUNTER — Ambulatory Visit (HOSPITAL_BASED_OUTPATIENT_CLINIC_OR_DEPARTMENT_OTHER): Payer: 59 | Admitting: Anesthesiology

## 2016-03-08 ENCOUNTER — Encounter (HOSPITAL_BASED_OUTPATIENT_CLINIC_OR_DEPARTMENT_OTHER): Payer: Self-pay

## 2016-03-08 ENCOUNTER — Ambulatory Visit (HOSPITAL_BASED_OUTPATIENT_CLINIC_OR_DEPARTMENT_OTHER)
Admission: RE | Admit: 2016-03-08 | Discharge: 2016-03-08 | Disposition: A | Discharge status: Home / Self Care | Payer: 59 | Source: Ambulatory Visit | Attending: Orthopedic Surgery | Admitting: Orthopedic Surgery

## 2016-03-08 DIAGNOSIS — Z6841 Body Mass Index (BMI) 40.0 and over, adult: Secondary | ICD-10-CM | POA: Insufficient documentation

## 2016-03-08 DIAGNOSIS — X501XXA Overexertion from prolonged static or awkward postures, initial encounter: Secondary | ICD-10-CM | POA: Diagnosis not present

## 2016-03-08 DIAGNOSIS — F418 Other specified anxiety disorders: Secondary | ICD-10-CM | POA: Insufficient documentation

## 2016-03-08 DIAGNOSIS — J45909 Unspecified asthma, uncomplicated: Secondary | ICD-10-CM | POA: Insufficient documentation

## 2016-03-08 DIAGNOSIS — M1990 Unspecified osteoarthritis, unspecified site: Secondary | ICD-10-CM | POA: Insufficient documentation

## 2016-03-08 DIAGNOSIS — S83241A Other tear of medial meniscus, current injury, right knee, initial encounter: Secondary | ICD-10-CM | POA: Insufficient documentation

## 2016-03-08 DIAGNOSIS — K219 Gastro-esophageal reflux disease without esophagitis: Secondary | ICD-10-CM | POA: Insufficient documentation

## 2016-03-08 HISTORY — DX: Major depressive disorder, single episode, unspecified: F32.9

## 2016-03-08 HISTORY — DX: Anxiety disorder, unspecified: F41.9

## 2016-03-08 HISTORY — DX: Depression, unspecified: F32.A

## 2016-03-08 HISTORY — PX: KNEE ARTHROSCOPY WITH MEDIAL MENISECTOMY: SHX5651

## 2016-03-08 HISTORY — DX: Irritable bowel syndrome, unspecified: K58.9

## 2016-03-08 HISTORY — PX: KNEE ARTHROSCOPY WITH LATERAL MENISECTOMY: SHX6193

## 2016-03-08 SURGERY — ARTHROSCOPY, KNEE, WITH MEDIAL MENISCECTOMY
Anesthesia: Monitor Anesthesia Care | Site: Knee | Laterality: Right

## 2016-03-08 MED ORDER — FENTANYL CITRATE (PF) 100 MCG/2ML IJ SOLN
INTRAMUSCULAR | Status: AC
  Filled 2016-03-08: qty 2

## 2016-03-08 MED ORDER — EPHEDRINE SULFATE 50 MG/ML IJ SOLN
INTRAMUSCULAR | Status: DC | PRN
  Administered 2016-03-08: 10 mg via INTRAVENOUS

## 2016-03-08 MED ORDER — DEXAMETHASONE SODIUM PHOSPHATE 4 MG/ML IJ SOLN
INTRAMUSCULAR | Status: DC | PRN
  Administered 2016-03-08: 10 mg via INTRAVENOUS

## 2016-03-08 MED ORDER — DEXAMETHASONE SODIUM PHOSPHATE 10 MG/ML IJ SOLN
INTRAMUSCULAR | Status: AC
  Filled 2016-03-08: qty 1

## 2016-03-08 MED ORDER — PROPOFOL 10 MG/ML IV BOLUS
INTRAVENOUS | Status: DC | PRN
  Administered 2016-03-08: 150 mg via INTRAVENOUS

## 2016-03-08 MED ORDER — PROPOFOL 500 MG/50ML IV EMUL
INTRAVENOUS | Status: AC
Start: 2016-03-08 — End: 2016-03-08
  Filled 2016-03-08: qty 100

## 2016-03-08 MED ORDER — CHLORHEXIDINE GLUCONATE 4 % EX LIQD: 60.0000 mL | Freq: Once | CUTANEOUS | Status: DC

## 2016-03-08 MED ORDER — MIDAZOLAM HCL 2 MG/2ML IJ SOLN
INTRAMUSCULAR | Status: AC
  Filled 2016-03-08: qty 2

## 2016-03-08 MED ORDER — METHYLPREDNISOLONE ACETATE 80 MG/ML IJ SUSP
INTRAMUSCULAR | Status: DC | PRN
  Administered 2016-03-08: 80 mg

## 2016-03-08 MED ORDER — ONDANSETRON HCL 4 MG/2ML IJ SOLN: 4.0000 mg | Freq: Four times a day (QID) | INTRAMUSCULAR | Status: DC | PRN

## 2016-03-08 MED ORDER — PROPOFOL 500 MG/50ML IV EMUL
INTRAVENOUS | Status: DC | PRN
  Administered 2016-03-08: 100 ug/kg/min via INTRAVENOUS

## 2016-03-08 MED ORDER — GLYCOPYRROLATE 0.2 MG/ML IJ SOLN: 0.2000 mg | Freq: Once | INTRAMUSCULAR | Status: DC | PRN

## 2016-03-08 MED ORDER — BUPIVACAINE-EPINEPHRINE (PF) 0.5% -1:200000 IJ SOLN
INTRAMUSCULAR | Status: AC
  Filled 2016-03-08: qty 30

## 2016-03-08 MED ORDER — CLINDAMYCIN PHOSPHATE 900 MG/50ML IV SOLN
INTRAVENOUS | Status: AC
  Filled 2016-03-08: qty 50

## 2016-03-08 MED ORDER — ONDANSETRON HCL 4 MG/2ML IJ SOLN
INTRAMUSCULAR | Status: AC
  Filled 2016-03-08: qty 2

## 2016-03-08 MED ORDER — SCOPOLAMINE 1 MG/3DAYS TD PT72: 1.0000 | MEDICATED_PATCH | Freq: Once | TRANSDERMAL | Status: DC | PRN

## 2016-03-08 MED ORDER — LIDOCAINE 2% (20 MG/ML) 5 ML SYRINGE
INTRAMUSCULAR | Status: AC
  Filled 2016-03-08: qty 5

## 2016-03-08 MED ORDER — SODIUM CHLORIDE 0.9 % IR SOLN
Status: DC | PRN
Start: 2016-03-08 — End: 2016-03-08
  Administered 2016-03-08: 3000 mL

## 2016-03-08 MED ORDER — FENTANYL CITRATE (PF) 100 MCG/2ML IJ SOLN
50.0000 ug | INTRAMUSCULAR | Status: AC | PRN
  Administered 2016-03-08: 25 ug via INTRAVENOUS
  Administered 2016-03-08: 50 ug via INTRAVENOUS
  Administered 2016-03-08: 25 ug via INTRAVENOUS

## 2016-03-08 MED ORDER — LACTATED RINGERS IV SOLN: INTRAVENOUS | Status: DC

## 2016-03-08 MED ORDER — HYDROMORPHONE HCL 1 MG/ML IJ SOLN: 0.2500 mg | INTRAMUSCULAR | Status: DC | PRN

## 2016-03-08 MED ORDER — BUPIVACAINE-EPINEPHRINE (PF) 0.5% -1:200000 IJ SOLN
INTRAMUSCULAR | Status: DC | PRN
  Administered 2016-03-08: 30 mL via PERINEURAL

## 2016-03-08 MED ORDER — EPHEDRINE 5 MG/ML INJ
INTRAVENOUS | Status: AC
  Filled 2016-03-08: qty 10

## 2016-03-08 MED ORDER — LACTATED RINGERS IV SOLN
INTRAVENOUS | Status: DC
  Administered 2016-03-08: 12:00:00 via INTRAVENOUS

## 2016-03-08 MED ORDER — CLINDAMYCIN PHOSPHATE 900 MG/50ML IV SOLN
900.0000 mg | INTRAVENOUS | Status: AC
  Administered 2016-03-08: 900 mg via INTRAVENOUS

## 2016-03-08 MED ORDER — LIDOCAINE HCL (CARDIAC) 20 MG/ML IV SOLN
INTRAVENOUS | Status: DC | PRN
  Administered 2016-03-08: 50 mg via INTRAVENOUS
  Administered 2016-03-08: 25 mg via INTRAVENOUS

## 2016-03-08 MED ORDER — BUPIVACAINE HCL (PF) 0.5 % IJ SOLN
INTRAMUSCULAR | Status: DC | PRN
  Administered 2016-03-08: 10 mL

## 2016-03-08 MED ORDER — MIDAZOLAM HCL 2 MG/2ML IJ SOLN
1.0000 mg | INTRAMUSCULAR | Status: DC | PRN
  Administered 2016-03-08 (×2): 1 mg via INTRAVENOUS

## 2016-03-08 MED ORDER — METHYLPREDNISOLONE ACETATE 80 MG/ML IJ SUSP
INTRAMUSCULAR | Status: AC
  Filled 2016-03-08: qty 1

## 2016-03-08 SURGICAL SUPPLY — 40 items
BANDAGE ACE 6X5 VEL STRL LF (GAUZE/BANDAGES/DRESSINGS) ×3 IMPLANT
BLADE CUDA 5.5 (BLADE) IMPLANT
BLADE CUDA GRT WHITE 3.5 (BLADE) IMPLANT
BLADE CUTTER GATOR 3.5 (BLADE) ×3 IMPLANT
BLADE CUTTER MENIS 5.5 (BLADE) IMPLANT
BLADE GREAT WHITE 4.2 (BLADE) ×2 IMPLANT
BLADE GREAT WHITE 4.2MM (BLADE) ×1
BUR OVAL 4.0 (BURR) IMPLANT
CUTTER MENISCUS  4.2MM (BLADE)
CUTTER MENISCUS 4.2MM (BLADE) IMPLANT
DRAPE ARTHROSCOPY W/POUCH 90 (DRAPES) ×3 IMPLANT
DURAPREP 26ML APPLICATOR (WOUND CARE) ×3 IMPLANT
ELECT MENISCUS 165MM 90D (ELECTRODE) IMPLANT
ELECT REM PT RETURN 9FT ADLT (ELECTROSURGICAL)
ELECTRODE REM PT RTRN 9FT ADLT (ELECTROSURGICAL) IMPLANT
GAUZE SPONGE 4X4 12PLY STRL (GAUZE/BANDAGES/DRESSINGS) ×3 IMPLANT
GAUZE XEROFORM 1X8 LF (GAUZE/BANDAGES/DRESSINGS) ×3 IMPLANT
GLOVE BIO SURGEON STRL SZ 6.5 (GLOVE) ×1 IMPLANT
GLOVE BIO SURGEONS STRL SZ 6.5 (GLOVE) ×1
GLOVE BIOGEL PI IND STRL 7.0 (GLOVE) ×1 IMPLANT
GLOVE BIOGEL PI INDICATOR 7.0 (GLOVE) ×6
GLOVE ECLIPSE 7.0 STRL STRAW (GLOVE) ×3 IMPLANT
GLOVE SURG ORTHO 8.0 STRL STRW (GLOVE) ×3 IMPLANT
GOWN STRL REUS W/ TWL LRG LVL3 (GOWN DISPOSABLE) ×2 IMPLANT
GOWN STRL REUS W/ TWL XL LVL3 (GOWN DISPOSABLE) ×1 IMPLANT
GOWN STRL REUS W/TWL LRG LVL3 (GOWN DISPOSABLE) ×6
GOWN STRL REUS W/TWL XL LVL3 (GOWN DISPOSABLE) ×3
HOLDER KNEE FOAM BLUE (MISCELLANEOUS) ×3 IMPLANT
IV NS IRRIG 3000ML ARTHROMATIC (IV SOLUTION) ×6 IMPLANT
KNEE WRAP E Z 3 GEL PACK (MISCELLANEOUS) ×3 IMPLANT
MANIFOLD NEPTUNE II (INSTRUMENTS) ×3 IMPLANT
PACK ARTHROSCOPY DSU (CUSTOM PROCEDURE TRAY) ×3 IMPLANT
PACK BASIN DAY SURGERY FS (CUSTOM PROCEDURE TRAY) ×3 IMPLANT
PENCIL BUTTON HOLSTER BLD 10FT (ELECTRODE) IMPLANT
SET ARTHROSCOPY TUBING (MISCELLANEOUS) ×3
SET ARTHROSCOPY TUBING LN (MISCELLANEOUS) ×1 IMPLANT
SUT ETHILON 3 0 PS 1 (SUTURE) ×3 IMPLANT
SUT VIC AB 3-0 FS2 27 (SUTURE) IMPLANT
TOWEL OR 17X24 6PK STRL BLUE (TOWEL DISPOSABLE) ×3 IMPLANT
WATER STERILE IRR 1000ML POUR (IV SOLUTION) ×3 IMPLANT

## 2016-03-08 NOTE — Anesthesia Procedure Notes (Addendum)
Anesthesia Regional Block:  Femoral nerve block  Pre-Anesthetic Checklist: ,, timeout performed, Correct Patient, Correct Site, Correct Laterality, Correct Procedure,, site marked, risks and benefits discussed, Surgical consent,  Pre-op evaluation,  At surgeon's request and post-op pain management  Laterality: Right  Prep: chloraprep       Needles:  Injection technique: Single-shot  Needle Type: Echogenic Stimulator Needle     Needle Length: 9cm 9 cm Needle Gauge: 21 and 21 G    Additional Needles:  Procedures: nerve stimulator Femoral nerve block  Nerve Stimulator or Paresthesia:  Response: Quadriceps muscle contraction, 0.45 mA,   Additional Responses:   Narrative:  Start time: 03/08/2016 12:28 PM End time: 03/08/2016 12:37 PM Injection made incrementally with aspirations every 5 mL.  Performed by: Personally  Anesthesiologist: HODIERNE, ADAM  Additional Notes: Functioning IV was confirmed and monitors were applied.  A 23mm 21ga Arrow echogenic stimulator needle was used. Sterile prep and drape,hand hygiene and sterile gloves were used.  Negative aspiration and negative test dose prior to incremental administration of local anesthetic. The patient tolerated the procedure well.     Procedure Name: LMA Insertion Performed by: Terrance Mass Pre-anesthesia Checklist: Patient identified, Emergency Drugs available, Suction available and Patient being monitored Patient Re-evaluated:Patient Re-evaluated prior to inductionOxygen Delivery Method: Circle System Utilized Preoxygenation: Pre-oxygenation with 100% oxygen Intubation Type: IV induction Ventilation: Mask ventilation without difficulty LMA: LMA inserted LMA Size: 4.0 Number of attempts: 1 Placement Confirmation: positive ETCO2 Tube secured with: Tape Dental Injury: Teeth and Oropharynx as per pre-operative assessment

## 2016-03-08 NOTE — Transfer of Care (Signed)
Immediate Anesthesia Transfer of Care Note  Patient: Tamara Williams  Procedure(s) Performed: Procedure(s): RIGHT KNEE ARTHROSCOPY CHONDROPLASTY WITH MEDIAL MENISCECTOMY (Right)  Patient Location: PACU  Anesthesia Type:General  Level of Consciousness: awake and sedated  Airway & Oxygen Therapy: Patient Spontanous Breathing and Patient connected to face mask oxygen  Post-op Assessment: Report given to RN and Post -op Vital signs reviewed and stable  Post vital signs: Reviewed and stable  Last Vitals: There were no vitals filed for this visit.  Last Pain:  Filed Vitals:   03/08/16 1148  PainSc: 6       Patients Stated Pain Goal: 4 ([id number])  Complications: No apparent anesthesia complications

## 2016-03-08 NOTE — Anesthesia Preprocedure Evaluation (Addendum)
Anesthesia Evaluation  Patient identified by MRN, date of birth, ID band Patient awake    Reviewed: Allergy & Precautions, NPO status , Patient's Chart, lab work & pertinent test results  Airway Mallampati: II   Neck ROM: full    Dental   Pulmonary asthma ,    breath sounds clear to auscultation       Cardiovascular negative cardio ROS   Rhythm:regular Rate:Normal     Neuro/Psych Anxiety Depression  Neuromuscular disease    GI/Hepatic GERD  ,  Endo/Other  Morbid obesity  Renal/GU      Musculoskeletal  (+) Arthritis ,   Abdominal   Peds  Hematology   Anesthesia Other Findings   Reproductive/Obstetrics                             Anesthesia Physical Anesthesia Plan  ASA: II  Anesthesia Plan: MAC and Regional   Post-op Pain Management:    Induction: Intravenous  Airway Management Planned: Simple Face Mask  Additional Equipment:   Intra-op Plan:   Post-operative Plan:   Informed Consent: I have reviewed the patients History and Physical, chart, labs and discussed the procedure including the risks, benefits and alternatives for the proposed anesthesia with the patient or authorized representative who has indicated his/her understanding and acceptance.     Plan Discussed with: CRNA, Anesthesiologist and Surgeon  Anesthesia Plan Comments:        Anesthesia Quick Evaluation

## 2016-03-08 NOTE — Progress Notes (Signed)
Assisted Dr. Hodierne with right, ultrasound guided, femoral block. Side rails up, monitors on throughout procedure. See vital signs in flow sheet. Tolerated Procedure well. 

## 2016-03-08 NOTE — Interval H&P Note (Signed)
History and Physical Interval Note:  03/08/2016 8:20 AM  Tamara Williams  has presented today for surgery, with the diagnosis of CHONDROMALACIA PATELLAE RIGHT KNEE OTHER TEAR OF MEDIAL MENISCUS CURRENT INJURY UNSPECIFIED KNEE RIGHT KNEE   The various methods of treatment have been discussed with the patient and family. After consideration of risks, benefits and other options for treatment, the patient has consented to  Procedure(s): RIGHT KNEE ARTHROSCOPY CHONDROPLASTY WITH MEDIAL MENISCECTOMY (Right) as a surgical intervention .  The patient's history has been reviewed, patient examined, no change in status, stable for surgery.  I have reviewed the patient's chart and labs.  Questions were answered to the patient's satisfaction.     Ninetta Lights

## 2016-03-08 NOTE — Discharge Instructions (Signed)
Discharge Instructions after Knee Arthroscopy  WEAR KNEE IMMOBILIZER UNTIL BLOCK HAS WORN OFF  You will have a light dressing on your knee.  Leave the dressing in place until the third day after your surgery and then remove it and place a band-aid over the stitches.  After the bandage has been removed you may shower, but do not soak the incision. You may begin gentle motion of your leg immediately after surgery. Pump your foot up and down 20 times per hour, every hour you are awake.  Apply ice to the knee 3 times per day for 30 minutes for the first 1 week until your knee is feeling comfortable again. Do not use heat.  You may begin straight leg raising exercises (if you have a brace with it on). While lying down, pull your foot all the way up, tighten your quadriceps muscle and lift your heel off of the ground. Hold this position for 2 seconds, and then let the leg back down. Repeat the exercise 10 times, at least 3 times a day.  Pain medicine has been prescribed for you.  Use your medicine as needed over the first 48 hours, and then you can begin to taper your use. You may take Extra Strength Tylenol or Tylenol only in place of the pain pills.    Please call [phone] for any problems. Including the following:  - excessive redness of the incisions - drainage for more than 4 days - fever of more than 101.5 F  *Please note that pain medications will not be refilled after hours or on weekends.   Post Anesthesia Home Care Instructions  Activity: Get plenty of rest for the remainder of the day. A responsible adult should stay with you for 24 hours following the procedure.  For the next 24 hours, DO NOT: -Drive a car -Paediatric nurse -Drink alcoholic beverages -Take any medication unless instructed by your physician -Make any legal decisions or sign important papers.  Meals: Start with liquid foods such as gelatin or soup. Progress to regular foods as tolerated. Avoid greasy,  spicy, heavy foods. If nausea and/or vomiting occur, drink only clear liquids until the nausea and/or vomiting subsides. Call your physician if vomiting continues.  Special Instructions/Symptoms: Your throat may feel dry or sore from the anesthesia or the breathing tube placed in your throat during surgery. If this causes discomfort, gargle with warm salt water. The discomfort should disappear within 24 hours.  If you had a scopolamine patch placed behind your ear for the management of post- operative nausea and/or vomiting:  1. The medication in the patch is effective for 72 hours, after which it should be removed.  Wrap patch in a tissue and discard in the trash. Wash hands thoroughly with soap and water. 2. You may remove the patch earlier than 72 hours if you experience unpleasant side effects which may include dry mouth, dizziness or visual disturbances. 3. Avoid touching the patch. Wash your hands with soap and water after contact with the patch.     Regional Anesthesia Blocks  1. Numbness or the inability to move the "blocked" extremity may last from 3-48 hours after placement. The length of time depends on the medication injected and your individual response to the medication. If the numbness is not going away after 48 hours, call your surgeon.  2. The extremity that is blocked will need to be protected until the numbness is gone and the  Strength has returned. Because you cannot feel it, you  will need to take extra care to avoid injury. Because it may be weak, you may have difficulty moving it or using it. You may not know what position it is in without looking at it while the block is in effect.  3. For blocks in the legs and feet, returning to weight bearing and walking needs to be done carefully. You will need to wait until the numbness is entirely gone and the strength has returned. You should be able to move your leg and foot normally before you try and bear weight or walk. You will  need someone to be with you when you first try to ensure you do not fall and possibly risk injury.  4. Bruising and tenderness at the needle site are common side effects and will resolve in a few days.  5. Persistent numbness or new problems with movement should be communicated to the surgeon or the [place] [phone] [place] [phone]).

## 2016-03-08 NOTE — Anesthesia Postprocedure Evaluation (Signed)
Anesthesia Post Note  Patient: Tamara Williams  Procedure(s) Performed: Procedure(s) (LRB): RIGHT KNEE ARTHROSCOPY CHONDROPLASTY WITH MEDIAL MENISCECTOMY (Right) KNEE ARTHROSCOPY WITH PARTIAL LATERAL MENISECTOMY (Right)  Patient location during evaluation: PACU Anesthesia Type: General Level of consciousness: awake and alert and patient cooperative Pain management: pain level controlled Vital Signs Assessment: post-procedure vital signs reviewed and stable Respiratory status: spontaneous breathing and respiratory function stable Cardiovascular status: stable Anesthetic complications: no    Last Vitals:  Filed Vitals:   03/08/16 1345 03/08/16 1400  BP: 116/60 107/84  Pulse: 90 86  Temp:    Resp: 12 15    Last Pain:  Filed Vitals:   03/08/16 1407  PainSc: 0-No pain        RLE Motor Response: Purposeful movement (03/08/16 1428) RLE Sensation: Numbness;No pain;No tingling (03/08/16 1428)      [place]

## 2016-03-09 ENCOUNTER — Encounter (HOSPITAL_BASED_OUTPATIENT_CLINIC_OR_DEPARTMENT_OTHER): Payer: Self-pay | Admitting: Orthopedic Surgery

## 2016-03-09 NOTE — Op Note (Signed)
Tamara Williams, Tamara Williams                ACCOUNT NO.:  [id number]  MEDICAL RECORD NO.:  [id number]  LOCATION:                                 FACILITY:  PHYSICIAN:  Ninetta Lights, M.D. DATE OF BIRTH:  Feb 23, 1951  DATE OF PROCEDURE:  03/08/2016 DATE OF DISCHARGE:                              OPERATIVE REPORT   PREOPERATIVE DIAGNOSIS:  Right knee new medial meniscus tear.  POSTOPERATIVE DIAGNOSIS:  Right knee new medial meniscus tear.  Radial tear with extension, posterior third medial meniscus.  Also further degenerative tearing, middle third lateral meniscus.  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  PROCEDURES:  Right knee exam under anesthesia and arthroscopy.  Partial medial meniscectomy.  Debridement of lateral meniscus.  Tricompartmental chondroplasty.  SURGEON:  Ninetta Lights, M.D.  ASSISTANT:  Elmyra Ricks, PA.  ANESTHESIA:  Attempted block and sedation followed by general.  SPECIMENS:  None.  CULTURES:  None.  COMPLICATIONS:  None.  DRESSING:  Soft compressive.  TOURNIQUET:  Not employed.  DESCRIPTION OF PROCEDURE:  The patient was brought to the operating room and placed on the operating table in supine position.  After adequate anesthesia had been obtained, leg holder applied, leg prepped and draped in usual sterile fashion.  Just too uncomfortable to do a block alone. Proceeded with general anesthesia.  Two portals; one each medial and lateral parapatellar.  Arthroscope was introduced.  Knee was distended and inspected.  Good patellar tracking, but relatively extensive grade 3 changes, mostly on the trochlea.  Chondroplasty to the stable surface. ACL, some laxity, but intact.  Lateral compartment, grade 2 changes. Previous meniscectomy.  New radial tears middle third, saucerized out, tapered in smoothly.  Medially, there was a large radial tear, displaced posterior horn medial  meniscus with extension.  Saucerized out to the rim.  Tapered into remaining meniscus, removing most of posterior third. Grade 3 changes over the entire condyle with a new chondral flap debrided.  Nothing grade 4. All loose bodies removed.  Instruments and fluids were removed.  Portals were closed with nylon.  Knee injected with Depo-Medrol and Marcaine. Sterile compressive dressing applied.  Anesthesia reversed.  Brought to the recovery room.  Tolerated the surgery well.  No complications.     Ninetta Lights, M.D.   ______________________________ Ninetta Lights, M.D.    DFM/MEDQ  D:  03/08/2016  T:  03/08/2016  Job:  [id number]

## 2016-07-27 ENCOUNTER — Other Ambulatory Visit: Payer: Self-pay | Admitting: Family

## 2016-07-27 DIAGNOSIS — K909 Intestinal malabsorption, unspecified: Secondary | ICD-10-CM

## 2016-07-27 DIAGNOSIS — D508 Other iron deficiency anemias: Secondary | ICD-10-CM

## 2016-07-27 DIAGNOSIS — D509 Iron deficiency anemia, unspecified: Secondary | ICD-10-CM | POA: Insufficient documentation

## 2016-10-23 ENCOUNTER — Other Ambulatory Visit: Payer: Self-pay | Admitting: Orthopedic Surgery

## 2016-10-23 DIAGNOSIS — M25531 Pain in right wrist: Secondary | ICD-10-CM

## 2016-10-25 ENCOUNTER — Ambulatory Visit
Admission: RE | Admit: 2016-10-25 | Discharge: 2016-10-25 | Disposition: A | Discharge status: Home / Self Care | Payer: 59 | Source: Ambulatory Visit | Attending: Orthopedic Surgery | Admitting: Orthopedic Surgery

## 2016-10-25 DIAGNOSIS — M25531 Pain in right wrist: Secondary | ICD-10-CM

## 2016-10-25 MED ORDER — IOPAMIDOL (ISOVUE-M 200) INJECTION 41%
3.0000 mL | Freq: Once | INTRAMUSCULAR | Status: AC
  Administered 2016-10-25: 3 mL via INTRA_ARTICULAR

## 2016-11-05 ENCOUNTER — Other Ambulatory Visit: Payer: 59

## 2017-01-17 ENCOUNTER — Other Ambulatory Visit
Admission: RE | Admit: 2017-01-17 | Discharge: 2017-01-17 | Disposition: A | Discharge status: Home / Self Care | Payer: Medicare Other | Source: Ambulatory Visit | Attending: Gastroenterology | Admitting: Gastroenterology

## 2017-01-17 DIAGNOSIS — R197 Diarrhea, unspecified: Secondary | ICD-10-CM | POA: Insufficient documentation

## 2017-01-17 LAB — GASTROINTESTINAL PANEL BY PCR, STOOL (REPLACES STOOL CULTURE)
ASTROVIRUS: NOT DETECTED
Adenovirus F40/41: NOT DETECTED
CAMPYLOBACTER SPECIES: NOT DETECTED
Cryptosporidium: NOT DETECTED
Cyclospora cayetanensis: NOT DETECTED
ENTAMOEBA HISTOLYTICA: NOT DETECTED
ENTEROTOXIGENIC E COLI (ETEC): NOT DETECTED
Enteroaggregative E coli (EAEC): NOT DETECTED
Enteropathogenic E coli (EPEC): NOT DETECTED
Giardia lamblia: NOT DETECTED
NOROVIRUS GI/GII: NOT DETECTED
PLESIMONAS SHIGELLOIDES: NOT DETECTED
Rotavirus A: NOT DETECTED
SAPOVIRUS (I, II, IV, AND V): NOT DETECTED
SHIGA LIKE TOXIN PRODUCING E COLI (STEC): NOT DETECTED
Salmonella species: NOT DETECTED
Shigella/Enteroinvasive E coli (EIEC): NOT DETECTED
VIBRIO CHOLERAE: NOT DETECTED
Vibrio species: NOT DETECTED
Yersinia enterocolitica: NOT DETECTED

## 2017-01-17 LAB — C DIFFICILE QUICK SCREEN W PCR REFLEX
C DIFFICILE (CDIFF) INTERP: NOT DETECTED
C DIFFICILE (CDIFF) TOXIN: NEGATIVE
C DIFFICLE (CDIFF) ANTIGEN: NEGATIVE

## 2017-05-20 ENCOUNTER — Encounter: Payer: Self-pay | Admitting: Podiatry

## 2017-05-20 ENCOUNTER — Ambulatory Visit (INDEPENDENT_AMBULATORY_CARE_PROVIDER_SITE_OTHER): Payer: Medicare Other

## 2017-05-20 ENCOUNTER — Ambulatory Visit (INDEPENDENT_AMBULATORY_CARE_PROVIDER_SITE_OTHER): Payer: Medicare Other | Admitting: Podiatry

## 2017-05-20 DIAGNOSIS — M79671 Pain in right foot: Secondary | ICD-10-CM | POA: Diagnosis not present

## 2017-05-20 DIAGNOSIS — M659 Synovitis and tenosynovitis, unspecified: Secondary | ICD-10-CM | POA: Diagnosis not present

## 2017-05-20 DIAGNOSIS — M7751 Other enthesopathy of right foot: Secondary | ICD-10-CM | POA: Diagnosis not present

## 2017-05-20 MED ORDER — BETAMETHASONE SOD PHOS & ACET 6 (3-3) MG/ML IJ SUSP: 3.0000 mg | Freq: Once | INTRAMUSCULAR | Status: DC

## 2017-05-20 NOTE — Progress Notes (Signed)
   Subjective:  Patient presents today for pain and tenderness to the right ankle. Patient relates significant pain and tenderness when walking. Patient presents for further treatment and evaluation. Patient also states that she experiences some pain and tenderness to the right great toe. A proximal he 6 weeks ago the patient stubbed her toe when she slipped and fell time. He experiences significant right hallux pain. She states that she was diagnosed with a fracture by [facility]. She presents today for follow-up treatment and evaluation    Objective / Physical Exam:  General:  The patient is alert and oriented x3 in no acute distress. Dermatology:  Skin is warm, dry and supple bilateral lower extremities. Negative for open lesions or macerations. Vascular:  Palpable pedal pulses bilaterally. No edema or erythema noted. Capillary refill within normal limits. Neurological:  Epicritic and protective threshold grossly intact bilaterally.  Musculoskeletal Exam:  Pain on palpation to the anterior lateral medial aspects of the patient's right ankle. Mild edema noted. Pain on palpation also noted with range of motion to the first metatarsophalangeal joint right foot  Range of motion within normal limits to all pedal and ankle joints bilateral. Muscle strength 5/5 in all groups bilateral.   Radiographic Exam:  Normal osseous mineralization. Joint spaces preserved. No fracture/dislocation/boney destruction.    Assessment: #1 pain in right ankle #2 synovitis of right ankle #3 first metatarsophalangeal joint capsulitis right  Plan of Care:  #1 Patient was evaluated. #2 injection of 0.5 mL Celestone Soluspan injected in the patient's right ankle. #3 injection of 0.5 mL Celestone Soluspan injected in the patient's right first MPJ #4 ankle brace dispensed #5 the patient are to 12 days of prednisone without any alleviation of symptoms #6 recommend over-the-counter insoles and on a  sports #7 return to clinic in 4 weeks   Edrick Kins, DPM [facility]  Dr. Edrick Kins, [address]                                        [place], [place] [address]                Office [phone]  Fax [phone]

## 2017-05-30 ENCOUNTER — Encounter: Admission: RE | Payer: Self-pay | Source: Ambulatory Visit

## 2017-05-30 ENCOUNTER — Ambulatory Visit: Admit: 2017-05-30 | Payer: Medicare Other | Admitting: Gastroenterology

## 2017-05-30 ENCOUNTER — Ambulatory Visit: Admission: RE | Admit: 2017-05-30 | Payer: Medicare Other | Source: Ambulatory Visit | Admitting: Gastroenterology

## 2017-05-30 SURGERY — COLONOSCOPY WITH PROPOFOL
Anesthesia: General

## 2017-06-17 ENCOUNTER — Ambulatory Visit: Payer: Medicare Other | Admitting: Podiatry

## 2017-11-11 ENCOUNTER — Ambulatory Visit
Admission: RE | Admit: 2017-11-11 | Discharge: 2017-11-11 | Disposition: A | Discharge status: Home / Self Care | Payer: Medicare Other | Source: Ambulatory Visit | Attending: Gastroenterology | Admitting: Gastroenterology

## 2017-11-11 ENCOUNTER — Encounter
Admission: RE | Disposition: A | Discharge status: Home / Self Care | Payer: Self-pay | Source: Ambulatory Visit | Attending: Gastroenterology

## 2017-11-11 ENCOUNTER — Encounter: Payer: Self-pay | Admitting: *Deleted

## 2017-11-11 ENCOUNTER — Ambulatory Visit: Payer: Medicare Other | Admitting: Anesthesiology

## 2017-11-11 DIAGNOSIS — Z9889 Other specified postprocedural states: Secondary | ICD-10-CM | POA: Diagnosis not present

## 2017-11-11 DIAGNOSIS — R197 Diarrhea, unspecified: Secondary | ICD-10-CM | POA: Insufficient documentation

## 2017-11-11 DIAGNOSIS — J449 Chronic obstructive pulmonary disease, unspecified: Secondary | ICD-10-CM | POA: Diagnosis not present

## 2017-11-11 DIAGNOSIS — R194 Change in bowel habit: Secondary | ICD-10-CM | POA: Diagnosis present

## 2017-11-11 DIAGNOSIS — R195 Other fecal abnormalities: Secondary | ICD-10-CM | POA: Diagnosis not present

## 2017-11-11 DIAGNOSIS — K317 Polyp of stomach and duodenum: Secondary | ICD-10-CM | POA: Diagnosis not present

## 2017-11-11 DIAGNOSIS — Z7951 Long term (current) use of inhaled steroids: Secondary | ICD-10-CM | POA: Diagnosis not present

## 2017-11-11 DIAGNOSIS — K2950 Unspecified chronic gastritis without bleeding: Secondary | ICD-10-CM | POA: Diagnosis not present

## 2017-11-11 DIAGNOSIS — K3189 Other diseases of stomach and duodenum: Secondary | ICD-10-CM | POA: Insufficient documentation

## 2017-11-11 DIAGNOSIS — E079 Disorder of thyroid, unspecified: Secondary | ICD-10-CM | POA: Diagnosis not present

## 2017-11-11 DIAGNOSIS — K210 Gastro-esophageal reflux disease with esophagitis: Secondary | ICD-10-CM | POA: Insufficient documentation

## 2017-11-11 DIAGNOSIS — Z7982 Long term (current) use of aspirin: Secondary | ICD-10-CM | POA: Insufficient documentation

## 2017-11-11 DIAGNOSIS — R1013 Epigastric pain: Secondary | ICD-10-CM | POA: Diagnosis not present

## 2017-11-11 DIAGNOSIS — K6289 Other specified diseases of anus and rectum: Secondary | ICD-10-CM | POA: Insufficient documentation

## 2017-11-11 DIAGNOSIS — Z791 Long term (current) use of non-steroidal anti-inflammatories (NSAID): Secondary | ICD-10-CM | POA: Diagnosis not present

## 2017-11-11 DIAGNOSIS — Z79899 Other long term (current) drug therapy: Secondary | ICD-10-CM | POA: Diagnosis not present

## 2017-11-11 DIAGNOSIS — F329 Major depressive disorder, single episode, unspecified: Secondary | ICD-10-CM | POA: Insufficient documentation

## 2017-11-11 DIAGNOSIS — Z79890 Hormone replacement therapy (postmenopausal): Secondary | ICD-10-CM | POA: Diagnosis not present

## 2017-11-11 HISTORY — PX: ESOPHAGOGASTRODUODENOSCOPY (EGD) WITH PROPOFOL: SHX5813

## 2017-11-11 HISTORY — PX: COLONOSCOPY WITH PROPOFOL: SHX5780

## 2017-11-11 SURGERY — COLONOSCOPY WITH PROPOFOL
Anesthesia: General

## 2017-11-11 MED ORDER — PROPOFOL 500 MG/50ML IV EMUL
INTRAVENOUS | Status: AC
  Filled 2017-11-11: qty 50

## 2017-11-11 MED ORDER — MIDAZOLAM HCL 5 MG/5ML IJ SOLN
INTRAMUSCULAR | Status: DC | PRN
  Administered 2017-11-11: 2 mg via INTRAVENOUS

## 2017-11-11 MED ORDER — PROPOFOL 10 MG/ML IV BOLUS
INTRAVENOUS | Status: AC
  Filled 2017-11-11: qty 20

## 2017-11-11 MED ORDER — SODIUM CHLORIDE 0.9 % IV SOLN: INTRAVENOUS | Status: DC

## 2017-11-11 MED ORDER — SODIUM CHLORIDE 0.9 % IV SOLN
INTRAVENOUS | Status: DC
  Administered 2017-11-11: 14:00:00 via INTRAVENOUS

## 2017-11-11 MED ORDER — PROPOFOL 10 MG/ML IV BOLUS
INTRAVENOUS | Status: DC | PRN
  Administered 2017-11-11: 70 mg via INTRAVENOUS
  Administered 2017-11-11: 30 mg via INTRAVENOUS

## 2017-11-11 MED ORDER — MIDAZOLAM HCL 2 MG/2ML IJ SOLN
INTRAMUSCULAR | Status: AC
  Filled 2017-11-11: qty 2

## 2017-11-11 MED ORDER — PROPOFOL 500 MG/50ML IV EMUL
INTRAVENOUS | Status: DC | PRN
  Administered 2017-11-11: 120 ug/kg/min via INTRAVENOUS

## 2017-11-11 MED ORDER — GLYCOPYRROLATE 0.2 MG/ML IJ SOLN
INTRAMUSCULAR | Status: DC | PRN
  Administered 2017-11-11: 0.2 mg via INTRAVENOUS

## 2017-11-11 MED ORDER — LIDOCAINE 2% (20 MG/ML) 5 ML SYRINGE
INTRAMUSCULAR | Status: DC | PRN
  Administered 2017-11-11: 25 mg via INTRAVENOUS

## 2017-11-11 NOTE — Anesthesia Postprocedure Evaluation (Signed)
Anesthesia Post Note  Patient: Tamara Williams  Procedure(s) Performed: COLONOSCOPY WITH PROPOFOL (N/A ) ESOPHAGOGASTRODUODENOSCOPY (EGD) WITH PROPOFOL (N/A )  Patient location during evaluation: Endoscopy Anesthesia Type: General Level of consciousness: awake and alert Pain management: pain level controlled Vital Signs Assessment: post-procedure vital signs reviewed and stable Respiratory status: spontaneous breathing, nonlabored ventilation, respiratory function stable and patient connected to nasal cannula oxygen Cardiovascular status: blood pressure returned to baseline and stable Postop Assessment: no apparent nausea or vomiting Anesthetic complications: no     Last Vitals:  Vitals:   11/11/17 1649 11/11/17 1659  BP: 101/90 111/70  Pulse: 82 86  Resp: 19 13  Temp:    SpO2: 99% 97%    Last Pain:  Vitals:   11/11/17 1659  TempSrc:   PainSc: 0-No pain                 Martha Clan

## 2017-11-11 NOTE — Anesthesia Post-op Follow-up Note (Signed)
Anesthesia QCDR form completed.        

## 2017-11-11 NOTE — Transfer of Care (Signed)
Immediate Anesthesia Transfer of Care Note  Patient: Tamara Williams  Procedure(s) Performed: COLONOSCOPY WITH PROPOFOL (N/A ) ESOPHAGOGASTRODUODENOSCOPY (EGD) WITH PROPOFOL (N/A )  Patient Location: PACU  Anesthesia Type:General  Level of Consciousness: awake, alert  and oriented  Airway & Oxygen Therapy: Patient Spontanous Breathing and Patient connected to nasal cannula oxygen  Post-op Assessment: Report given to RN and Post -op Vital signs reviewed and stable  Post vital signs: Reviewed and stable  Last Vitals:  Vitals:   11/11/17 1349  BP: 117/63  Pulse: 84  Resp: 18  Temp: (!) 36.1 C  SpO2: 99%    Last Pain:  Vitals:   11/11/17 1349  TempSrc: Tympanic         Complications: No apparent anesthesia complications

## 2017-11-11 NOTE — Op Note (Signed)
[facility] Gastroenterology Patient Name: Tamara Williams Procedure Date: 11/11/2017 3:08 PM MRN: [id number] Account #: [id number] Date of Birth: 03/11/1951 Admit Type: Outpatient Age: 67 Room: [facility] ENDO ROOM 1 Gender: Female Note Status: Finalized Procedure:            Upper GI endoscopy Indications:          Dyspepsia, Gastro-esophageal reflux disease Providers:            Lollie Sails, MD Referring MD:         Edwinna Areola. [place] Crane MD (Referring MD) Medicines:            Monitored Anesthesia Care Complications:        No immediate complications. Procedure:            Pre-Anesthesia Assessment:                       - ASA Grade Assessment: III - A patient with severe                        systemic disease.                       After obtaining informed consent, the endoscope was                        passed under direct vision. Throughout the procedure,                        the patient's blood pressure, pulse, and oxygen                        saturations were monitored continuously. The Endoscope                        was introduced through the mouth, and advanced to the                        fourth part of duodenum. The upper GI endoscopy was                        unusually difficult due to avid reflux with loss of                        insufflation. The patient tolerated the procedure well. Findings:      The Z-line was irregular. Biopsies were taken with a cold forceps for       histology. Biopsies were taken with a cold forceps for histology.      A prior Nissen fundoplication was found at the gastroesophageal       junction, associated with the irregular Z - line.      Localized mild inflammation characterized by congestion (edema) and       erythema was found in the gastric body and on the posterior wall of the       gastric body. Biopsies were taken with a cold forceps for histology.      A few 2 to 4 mm sessile polyps with no bleeding and no stigmata  of       recent bleeding were found in the cardia and on the anterior wall of the       gastric  body. Biopsies were taken with a cold forceps for histology.      Evidence of a prior Nissen fundoplication was found in the cardia. This       was characterized by healthy appearing mucosa in retroflex evaluation.      It was very difficult to attain adequate insufflation for evaluation of       the gastric vault.      The examined duodenum was normal. Biopsies were taken with a cold       forceps for histology. Impression:           - Z-line irregular. Biopsied.                       - A Nissen fundoplication was found.                       - Gastritis. Biopsied.                       - A few gastric polyps. Biopsied.                       - A Nissen fundoplication was found, characterized by                        healthy appearing mucosa.                       - Normal examined duodenum. Biopsied. Recommendation:       - Await pathology results. Procedure Code(s):    --- Professional ---                       [phone], Esophagogastroduodenoscopy, flexible, transoral;                        with biopsy, single or multiple Diagnosis Code(s):    --- Professional ---                       K22.8, Other specified diseases of esophagus                       Z98.890, Other specified postprocedural states                       K29.70, Gastritis, unspecified, without bleeding                       K31.7, Polyp of stomach and duodenum                       R10.13, Epigastric pain                       K21.9, Gastro-esophageal reflux disease without                        esophagitis CPT copyright 2016 American Medical Association. All rights reserved. The codes documented in this report are preliminary and upon coder review may  be revised to meet current compliance requirements. Lollie Sails, MD 11/11/2017 3:58:28 PM This report has been signed electronically. Number of Addenda: 0 Note  Initiated On: 11/11/2017 3:08 PM      [facility]

## 2017-11-11 NOTE — Anesthesia Preprocedure Evaluation (Signed)
Anesthesia Evaluation  Patient identified by MRN, date of birth, ID band Patient awake    Reviewed: Allergy & Precautions, H&P , NPO status , Patient's Chart, lab work & pertinent test results  History of Anesthesia Complications Negative for: history of anesthetic complications  Airway Mallampati: III  TM Distance: >3 FB Neck ROM: full    Dental  (+) Chipped   Pulmonary neg shortness of breath, asthma , COPD,           Cardiovascular Exercise Tolerance: Good (-) angina(-) DOE negative cardio ROS       Neuro/Psych PSYCHIATRIC DISORDERS Anxiety Depression  Neuromuscular disease    GI/Hepatic Neg liver ROS, GERD  Medicated and Controlled,  Endo/Other  negative endocrine ROS  Renal/GU negative Renal ROS  negative genitourinary   Musculoskeletal   Abdominal   Peds  Hematology negative hematology ROS (+)   Anesthesia Other Findings Past Medical History: No date: Anemia No date: Anxiety No date: Arthritis No date: Asthma No date: Chronic sciatica No date: Constipation No date: Degenerative disc disease No date: Depression No date: GERD (gastroesophageal reflux disease) No date: Irritable bowel syndrome (IBS) No date: Nausea and vomiting No date: Thyroid disease No date: Vertigo     Comment:  with sinus issues.  none since 01/17  Past Surgical History: 09/15/1988: ABDOMINAL HYSTERECTOMY 2002: BREAST LUMPECTOMY; Left No date: CARPAL TUNNEL RELEASE; Right No date: CHOLECYSTECTOMY 01/03/2010: FOOT SURGERY 07/21/2010: GASTROPLASTY     Comment:  Lap nissan 2015: KNEE ARTHROSCOPY; Right 03/08/2016: KNEE ARTHROSCOPY WITH LATERAL MENISECTOMY; Right     Comment:  Procedure: KNEE ARTHROSCOPY WITH PARTIAL LATERAL               MENISECTOMY;  Surgeon: Ninetta Lights, MD;  Location:               [employer];  Service: Orthopedics;                Laterality: Right; 03/08/2016: KNEE ARTHROSCOPY WITH MEDIAL  MENISECTOMY; Right     Comment:  Procedure: RIGHT KNEE ARTHROSCOPY CHONDROPLASTY WITH               MEDIAL MENISCECTOMY;  Surgeon: Ninetta Lights, MD;                Location: [facility];  Service:               Orthopedics;  Laterality: Right; 07/19/1988: OVARY SURGERY     Comment:  Removal 1996: SHOULDER SURGERY; Left  BMI    Body Mass Index:  35.71 kg/m      Reproductive/Obstetrics negative OB ROS                             Anesthesia Physical Anesthesia Plan  ASA: III  Anesthesia Plan: General   Post-op Pain Management:    Induction: Intravenous  PONV Risk Score and Plan: Propofol infusion and TIVA  Airway Management Planned: Natural Airway and Nasal Cannula  Additional Equipment:   Intra-op Plan:   Post-operative Plan:   Informed Consent: I have reviewed the patients History and Physical, chart, labs and discussed the procedure including the risks, benefits and alternatives for the proposed anesthesia with the patient or authorized representative who has indicated his/her understanding and acceptance.   Dental Advisory Given  Plan Discussed with: Anesthesiologist, CRNA and Surgeon  Anesthesia Plan Comments: (Patient consented for risks of anesthesia including but not  limited to:  - adverse reactions to medications - risk of intubation if required - damage to teeth, lips or other oral mucosa - sore throat or hoarseness - Damage to heart, brain, lungs or loss of life  Patient voiced understanding.)        Anesthesia Quick Evaluation

## 2017-11-11 NOTE — H&P (Signed)
Outpatient short stay form Pre-procedure 11/11/2017 3:10 PM Lollie Sails MD  Primary Physician: Delia Chimes NP  Reason for visit: EGD and colonoscopy  History of present illness: Patient is a 67 year old female presenting today as above.  She has noted a change in her bowel habits with diarrhea over the.  The past 5 months or so.  A stool check indicated a trace of blood in the stool.  She also has been having some dyspepsia symptoms and increased reflux.  Did see some more blood in her stool back during the summer.  She has had stool PCR testing done which was negative for infective organisms tested and C. difficile.    Current Facility-Administered Medications:  .  0.9 %  sodium chloride infusion, , Intravenous, Continuous, Lollie Sails, MD, Last Rate: 20 mL/hr at 11/11/17 1416 .  0.9 %  sodium chloride infusion, , Intravenous, Continuous, Lollie Sails, MD  Medications Prior to Admission  Medication Sig Dispense Refill Last Dose  . aspirin 81 MG tablet Take 81 mg by mouth daily.   11/10/2017 at Unknown time  . buPROPion (WELLBUTRIN SR) 150 MG 12 hr tablet Take 150 mg by mouth Twice daily.    11/10/2017 at Unknown time  . Calcium Carbonate-Vitamin D (CALCIUM + D PO) Take by mouth 2 (two) times daily.   11/10/2017 at Unknown time  . celecoxib (CELEBREX) 100 MG capsule Take 100 mg by mouth 2 (two) times daily.   Past Month at Unknown time  . citalopram (CELEXA) 20 MG tablet Take 20 mg by mouth daily.   11/10/2017 at Unknown time  . DEXILANT 60 MG capsule Take 60 mg by mouth daily.    11/10/2017 at Unknown time  . levothyroxine (SYNTHROID, LEVOTHROID) 25 MCG tablet Take by mouth.   11/10/2017 at Unknown time  . lovastatin (MEVACOR) 40 MG tablet Take by mouth.   11/09/2017 at Unknown time  . traZODone (DESYREL) 50 MG tablet Take 50 mg by mouth at bedtime.   Past Week at Unknown time  . VENTOLIN HFA 108 (90 BASE) MCG/ACT inhaler Inhale 1 puff into the lungs as needed.    11/10/2017 at  Unknown time  . budesonide (PULMICORT) 0.5 MG/2ML nebulizer solution 2 (two) times daily.    Not Taking at Unknown time  . ipratropium (ATROVENT) 0.06 % nasal spray Place into the nose.   11/08/2017  . lubiprostone (AMITIZA) 8 MCG capsule Take 8 mcg by mouth 2 (two) times daily with a meal.   11/09/2017  . montelukast (SINGULAIR) 10 MG tablet Take 10 mg by mouth every morning.    11/09/2017  . ondansetron (ZOFRAN-ODT) 4 MG disintegrating tablet Take 4 mg by mouth every 12 (twelve) hours as needed.    Not Taking at Unknown time  . ranitidine (ZANTAC) 150 MG tablet TAKE ONE TABLET BY MOUTH EVERY NIGHT AT BEDTIME   Not Taking at Unknown time  . vitamin C (ASCORBIC ACID) 500 MG tablet Take 500 mg by mouth daily.   11/04/2017  . Vitamin D, Ergocalciferol, (DRISDOL) 50000 units CAPS capsule TK 1 C PO Q 7 DAYS FOR 12 DOSES  0 11/04/2017     Allergies  Allergen Reactions  . Amoxicillin-Pot Clavulanate Nausea And Vomiting    vomiting  . Biaxin [Clarithromycin] Nausea Only  . Demerol [Meperidine] Itching  . Sulfamethoxazole-Trimethoprim Itching and Nausea And Vomiting  . Oxycodone Rash and Itching    itching  . Percocet [Oxycodone-Acetaminophen]     itch  .  Tetracyclines & Related Rash    itch     Past Medical History:  Diagnosis Date  . Anemia   . Anxiety   . Arthritis   . Asthma   . Chronic sciatica   . Constipation   . Degenerative disc disease   . Depression   . GERD (gastroesophageal reflux disease)   . Irritable bowel syndrome (IBS)   . Nausea and vomiting   . Thyroid disease   . Vertigo    with sinus issues.  none since 01/17    Review of systems:      Physical Exam    Heart and lungs: Regular rate and rhythm without rub or gallop, lungs are bilaterally clear.    HEENT: Normocephalic atraumatic eyes are anicteric    Other:    Pertinant exam for procedure: Soft nontender nondistended bowel sounds positive normoactive    Planned proceedures: EGD, colonoscopy and  indicated procedures. I have discussed the risks benefits and complications of procedures to include not limited to bleeding, infection, perforation and the risk of sedation and the patient wishes to proceed.    Lollie Sails, MD Gastroenterology 11/11/2017  3:10 PM

## 2017-11-11 NOTE — Op Note (Signed)
[facility] Gastroenterology Patient Name: Tamara Williams Procedure Date: 11/11/2017 3:07 PM MRN: [id number] Account #: [id number] Date of Birth: [birth year] Admit Type: Outpatient Age: 67 Room: [facility] ENDO ROOM 1 Gender: Female Note Status: Finalized Procedure:            Colonoscopy Indications:          Clinically significant diarrhea of unexplained origin,                        Heme positive stool, Change in bowel habits Providers:            Lollie Sails, MD Referring MD:         Edwinna Areola. [place] Crane MD (Referring MD) Medicines:            Monitored Anesthesia Care Complications:        No immediate complications. Procedure:            Pre-Anesthesia Assessment:                       - ASA Grade Assessment: III - A patient with severe                        systemic disease.                       After obtaining informed consent, the colonoscope was                        passed under direct vision. Throughout the procedure,                        the patient's blood pressure, pulse, and oxygen                        saturations were monitored continuously. The Olympus                        PCF-H180AL colonoscope ( S#: [id number] ) was introduced                        through the anus with the intention of advancing to the                        cecum. The scope was advanced to the transverse colon                        before the procedure was aborted. Medications were                        given. The patient tolerated the procedure well. The                        quality of the bowel preparation was fair except the                        transverse colon was poor. Findings:      Localized moderate inflammation characterized by congestion (edema),       erosions, erythema and granularity was found in the anterior mid rectum.       Biopsies were taken with  a cold forceps for histology. The scope was       then advanced to the region of the distal transverse  colon. The prep is       noted to be only fair in the left colon with areas of solid debris, and       once the transverse was attained, the prep was too poor to continue with       more than half of the colon surface obscured.      The digital rectal exam was normal. Impression:           - Localized moderate inflammation was found in the mid                        rectum. Biopsied. Recommendation:       - Will need to reprep and repeat colonoscopy due to                        poor prep.                       - Await pathology results. Procedure Code(s):    --- Professional ---                       [phone], 43, Colonoscopy, flexible; with biopsy, single                        or multiple Diagnosis Code(s):    --- Professional ---                       K62.89, Other specified diseases of anus and rectum                       R19.7, Diarrhea, unspecified                       R19.5, Other fecal abnormalities                       R19.4, Change in bowel habit CPT copyright 2016 American Medical Association. All rights reserved. The codes documented in this report are preliminary and upon coder review may  be revised to meet current compliance requirements. Lollie Sails, MD 11/11/2017 4:21:52 PM This report has been signed electronically. Number of Addenda: 0 Note Initiated On: 11/11/2017 3:07 PM Scope Withdrawal Time: 0 hours 0 minutes 2 seconds  Total Procedure Duration: 0 hours 11 minutes 46 seconds       [facility]

## 2017-11-12 ENCOUNTER — Encounter: Payer: Self-pay | Admitting: Gastroenterology

## 2017-11-12 NOTE — OR Nursing (Signed)
Pt. States she had a terrible experience with her medications . Refferred to anesthia.

## 2017-11-13 LAB — SURGICAL PATHOLOGY

## 2018-06-23 ENCOUNTER — Encounter: Payer: Self-pay | Admitting: Podiatry

## 2018-06-23 ENCOUNTER — Ambulatory Visit (INDEPENDENT_AMBULATORY_CARE_PROVIDER_SITE_OTHER): Payer: Medicare Other | Admitting: Podiatry

## 2018-06-23 ENCOUNTER — Ambulatory Visit (INDEPENDENT_AMBULATORY_CARE_PROVIDER_SITE_OTHER): Payer: Medicare Other

## 2018-06-23 DIAGNOSIS — M779 Enthesopathy, unspecified: Secondary | ICD-10-CM

## 2018-06-23 DIAGNOSIS — M659 Synovitis and tenosynovitis, unspecified: Secondary | ICD-10-CM

## 2018-06-23 DIAGNOSIS — S9031XA Contusion of right foot, initial encounter: Secondary | ICD-10-CM

## 2018-06-23 DIAGNOSIS — M778 Other enthesopathies, not elsewhere classified: Secondary | ICD-10-CM

## 2018-06-24 NOTE — Progress Notes (Signed)
   Subjective:  67 year old female presenting today for follow up evaluation of ankle and 1st MPJ pain of the right lower extremity. She states the pain has increased over the last three months. She also reports aching pain to the dorsal right midfoot that began three weeks ago. She states she dropped a shelf on the foot causing the pain to begin. Walking and wearing shoes increases the pain. She has not done anything for treatment but was evaluated by an orthopedist and had a negative X-Ray. Patient is here for further evaluation and treatment.   Past Medical History:  Diagnosis Date  . Anemia   . Anxiety   . Arthritis   . Asthma   . Chronic sciatica   . Constipation   . Degenerative disc disease   . Depression   . GERD (gastroesophageal reflux disease)   . Irritable bowel syndrome (IBS)   . Nausea and vomiting   . Thyroid disease   . Vertigo    with sinus issues.  none since 01/17     Objective / Physical Exam:  General:  The patient is alert and oriented x3 in no acute distress. Dermatology:  Skin is warm, dry and supple bilateral lower extremities. Negative for open lesions or macerations. Vascular:  Palpable pedal pulses bilaterally. No edema or erythema noted. Capillary refill within normal limits. Neurological:  Epicritic and protective threshold grossly intact bilaterally.  Musculoskeletal Exam:  Pain on palpation to the anterior lateral medial aspects of the patient's right ankle and right midfoot. Mild edema noted. Range of motion within normal limits to all pedal and ankle joints bilateral. Muscle strength 5/5 in all groups bilateral.   Radiographic Exam:  Normal osseous mineralization. Joint spaces preserved. No fracture/dislocation/boney destruction.    Assessment: 1. Right ankle synovitis 2. Right midfoot capsulitis   Plan of Care:  1. Patient was evaluated. X-Rays reviewed.  2. injection of 0.5 mL Celestone Soluspan injected in the patient's right ankle. 3.  Injection of 0.5 mLs Celestone Soluspan injected into the right midfoot.  4. CAM boot dispensed. Weightbearing as tolerated for two weeks.  5. Return to clinic in 4 weeks.   Edrick Kins, DPM [facility]  Dr. Edrick Kins, [address]                                        [place], [place] [address]                Office [phone]  Fax [phone]

## 2018-07-02 ENCOUNTER — Encounter: Payer: Self-pay | Admitting: *Deleted

## 2018-07-03 ENCOUNTER — Ambulatory Visit: Payer: Medicare Other | Admitting: Anesthesiology

## 2018-07-03 ENCOUNTER — Ambulatory Visit
Admission: RE | Admit: 2018-07-03 | Discharge: 2018-07-03 | Disposition: A | Discharge status: Home / Self Care | Payer: Medicare Other | Source: Ambulatory Visit | Attending: Gastroenterology | Admitting: Gastroenterology

## 2018-07-03 ENCOUNTER — Encounter
Admission: RE | Disposition: A | Discharge status: Home / Self Care | Payer: Self-pay | Source: Ambulatory Visit | Attending: Gastroenterology

## 2018-07-03 DIAGNOSIS — R194 Change in bowel habit: Secondary | ICD-10-CM | POA: Diagnosis present

## 2018-07-03 DIAGNOSIS — K5730 Diverticulosis of large intestine without perforation or abscess without bleeding: Secondary | ICD-10-CM | POA: Diagnosis not present

## 2018-07-03 DIAGNOSIS — Z882 Allergy status to sulfonamides status: Secondary | ICD-10-CM | POA: Insufficient documentation

## 2018-07-03 DIAGNOSIS — K219 Gastro-esophageal reflux disease without esophagitis: Secondary | ICD-10-CM | POA: Insufficient documentation

## 2018-07-03 DIAGNOSIS — Z885 Allergy status to narcotic agent status: Secondary | ICD-10-CM | POA: Diagnosis not present

## 2018-07-03 DIAGNOSIS — M1990 Unspecified osteoarthritis, unspecified site: Secondary | ICD-10-CM | POA: Diagnosis not present

## 2018-07-03 DIAGNOSIS — F329 Major depressive disorder, single episode, unspecified: Secondary | ICD-10-CM | POA: Diagnosis not present

## 2018-07-03 DIAGNOSIS — Z880 Allergy status to penicillin: Secondary | ICD-10-CM | POA: Diagnosis not present

## 2018-07-03 DIAGNOSIS — Z7982 Long term (current) use of aspirin: Secondary | ICD-10-CM | POA: Diagnosis not present

## 2018-07-03 DIAGNOSIS — Z7951 Long term (current) use of inhaled steroids: Secondary | ICD-10-CM | POA: Insufficient documentation

## 2018-07-03 DIAGNOSIS — J45909 Unspecified asthma, uncomplicated: Secondary | ICD-10-CM | POA: Diagnosis not present

## 2018-07-03 DIAGNOSIS — E039 Hypothyroidism, unspecified: Secondary | ICD-10-CM | POA: Diagnosis not present

## 2018-07-03 DIAGNOSIS — K6289 Other specified diseases of anus and rectum: Secondary | ICD-10-CM | POA: Diagnosis not present

## 2018-07-03 DIAGNOSIS — F419 Anxiety disorder, unspecified: Secondary | ICD-10-CM | POA: Insufficient documentation

## 2018-07-03 DIAGNOSIS — D124 Benign neoplasm of descending colon: Secondary | ICD-10-CM | POA: Insufficient documentation

## 2018-07-03 DIAGNOSIS — K589 Irritable bowel syndrome without diarrhea: Secondary | ICD-10-CM | POA: Diagnosis not present

## 2018-07-03 HISTORY — PX: COLONOSCOPY WITH PROPOFOL: SHX5780

## 2018-07-03 HISTORY — DX: Cardiac murmur, unspecified: R01.1

## 2018-07-03 HISTORY — DX: Hypothyroidism, unspecified: E03.9

## 2018-07-03 SURGERY — COLONOSCOPY WITH PROPOFOL
Anesthesia: General

## 2018-07-03 MED ORDER — PROPOFOL 10 MG/ML IV BOLUS
INTRAVENOUS | Status: DC | PRN
  Administered 2018-07-03: 20 mg via INTRAVENOUS
  Administered 2018-07-03 (×2): 30 mg via INTRAVENOUS
  Administered 2018-07-03 (×2): 20 mg via INTRAVENOUS
  Administered 2018-07-03 (×2): 30 mg via INTRAVENOUS
  Administered 2018-07-03 (×4): 20 mg via INTRAVENOUS
  Administered 2018-07-03 (×2): 30 mg via INTRAVENOUS
  Administered 2018-07-03 (×4): 20 mg via INTRAVENOUS

## 2018-07-03 MED ORDER — FENTANYL CITRATE (PF) 100 MCG/2ML IJ SOLN
INTRAMUSCULAR | Status: DC | PRN
  Administered 2018-07-03 (×2): 25 ug via INTRAVENOUS

## 2018-07-03 MED ORDER — PROPOFOL 500 MG/50ML IV EMUL
INTRAVENOUS | Status: AC
  Filled 2018-07-03: qty 50

## 2018-07-03 MED ORDER — SODIUM CHLORIDE 0.9 % IV SOLN
INTRAVENOUS | Status: DC
  Administered 2018-07-03: 1000 mL via INTRAVENOUS

## 2018-07-03 MED ORDER — FENTANYL CITRATE (PF) 100 MCG/2ML IJ SOLN
INTRAMUSCULAR | Status: AC
  Filled 2018-07-03: qty 2

## 2018-07-03 NOTE — Transfer of Care (Signed)
Immediate Anesthesia Transfer of Care Note  Patient: Tamara Williams  Procedure(s) Performed: COLONOSCOPY WITH PROPOFOL (N/A )  Patient Location: PACU  Anesthesia Type:General  Level of Consciousness: awake, alert  and oriented  Airway & Oxygen Therapy: Patient Spontanous Breathing and Patient connected to nasal cannula oxygen  Post-op Assessment: Report given to RN and Post -op Vital signs reviewed and stable  Post vital signs: Reviewed and stable  Last Vitals:  Vitals Value Taken Time  BP 102/80 07/03/2018 10:48 AM  Temp 36.2 C 07/03/2018 10:46 AM  Pulse 74 07/03/2018 10:49 AM  Resp 16 07/03/2018 10:49 AM  SpO2 100 % 07/03/2018 10:49 AM  Vitals shown include unvalidated device data.  Last Pain:  Vitals:   07/03/18 1048  TempSrc:   PainSc: 0-No pain         Complications: No apparent anesthesia complications

## 2018-07-03 NOTE — Op Note (Signed)
[facility] Gastroenterology Patient Name: Tamara Williams Procedure Date: 07/03/2018 9:55 AM MRN: [id number] Account #: [id number] Date of Birth: 02-Jan-1951 Admit Type: Outpatient Age: 67 Room: [facility] ENDO ROOM 1 Gender: Female Note Status: Finalized Procedure:            Colonoscopy Indications:          Heme positive stool, Change in bowel habits,                        Constipation Providers:            Lollie Sails, MD Referring MD:         Edwinna Areola. [place] Crane MD (Referring MD) Medicines:            Monitored Anesthesia Care Complications:        No immediate complications. Procedure:            Pre-Anesthesia Assessment:                       - ASA Grade Assessment: III - A patient with severe                        systemic disease.                       After obtaining informed consent, the colonoscope was                        passed under direct vision. Throughout the procedure,                        the patient's blood pressure, pulse, and oxygen                        saturations were monitored continuously. The                        Colonoscope was introduced through the anus and                        advanced to the the cecum, identified by appendiceal                        orifice and ileocecal valve. The colonoscopy was                        performed without difficulty. The patient tolerated the                        procedure well. The quality of the bowel preparation                        was good. Findings:      Multiple medium-mouthed diverticula were found in the sigmoid colon and       descending colon.      A 3 mm polyp was found in the distal descending colon. The polyp was       sessile. The polyp was removed with a cold biopsy forceps. Resection and       retrieval were complete.      A localized area of mildly erythematous mucosa was found in  the mid       rectum. This was biopsied with a cold forceps for histology.      No  additional abnormalities were found on retroflexion.      The digital rectal exam was normal. Impression:           - Diverticulosis in the sigmoid colon and in the                        descending colon.                       - One 3 mm polyp in the distal descending colon,                        removed with a cold biopsy forceps. Resected and                        retrieved.                       - Erythematous mucosa in the mid rectum. Biopsied. Recommendation:       - Discharge patient to home.                       - Await pathology results.                       - Return to GI clinic in 4 weeks. Procedure Code(s):    --- Professional ---                       [phone], Colonoscopy, flexible; with biopsy, single or                        multiple Diagnosis Code(s):    --- Professional ---                       D12.4, Benign neoplasm of descending colon                       K62.89, Other specified diseases of anus and rectum                       R19.5, Other fecal abnormalities                       R19.4, Change in bowel habit                       K59.00, Constipation, unspecified                       K57.30, Diverticulosis of large intestine without                        perforation or abscess without bleeding CPT copyright 2017 American Medical Association. All rights reserved. The codes documented in this report are preliminary and upon coder review may  be revised to meet current compliance requirements. Lollie Sails, MD 07/03/2018 10:46:59 AM This report has been signed electronically. Number of Addenda: 0 Note Initiated On: 07/03/2018 9:55 AM Scope Withdrawal Time: 0 hours 14 minutes 27 seconds  Total Procedure Duration: 0  hours 25 minutes 19 seconds       [facility]

## 2018-07-03 NOTE — Anesthesia Post-op Follow-up Note (Signed)
Anesthesia QCDR form completed.        

## 2018-07-03 NOTE — Anesthesia Postprocedure Evaluation (Signed)
Anesthesia Post Note  Patient: Tamara Williams  Procedure(s) Performed: COLONOSCOPY WITH PROPOFOL (N/A )  Anesthesia Post Evaluation   Last Vitals:  Vitals:   07/03/18 1046 07/03/18 1048  BP: 106/80 102/80  Pulse: 76 77  Resp: 16 18  Temp: (!) 36.2 C   SpO2: 100% 99%    Last Pain:  Vitals:   07/03/18 1048  TempSrc:   PainSc: 0-No pain                 Molli Barrows

## 2018-07-03 NOTE — Anesthesia Preprocedure Evaluation (Signed)
Anesthesia Evaluation  Patient identified by MRN, date of birth, ID band Patient awake    Reviewed: Allergy & Precautions, H&P , NPO status , Patient's Chart, lab work & pertinent test results, reviewed documented beta blocker date and time   Airway Mallampati: II   Neck ROM: full    Dental  (+) Poor Dentition   Pulmonary neg pulmonary ROS, asthma ,    Pulmonary exam normal        Cardiovascular Exercise Tolerance: Good negative cardio ROS Normal cardiovascular exam+ Valvular Problems/Murmurs  Rhythm:regular Rate:Normal     Neuro/Psych PSYCHIATRIC DISORDERS Anxiety Depression  Neuromuscular disease negative neurological ROS  negative psych ROS   GI/Hepatic negative GI ROS, Neg liver ROS, GERD  Medicated,  Endo/Other  negative endocrine ROSHypothyroidism   Renal/GU negative Renal ROS  negative genitourinary   Musculoskeletal   Abdominal   Peds  Hematology negative hematology ROS (+) anemia ,   Anesthesia Other Findings Past Medical History: No date: Anemia No date: Anxiety No date: Arthritis No date: Asthma No date: Chronic sciatica No date: Constipation No date: Degenerative disc disease No date: Depression No date: GERD (gastroesophageal reflux disease) No date: Heart murmur No date: Hypothyroidism No date: Irritable bowel syndrome (IBS) No date: Nausea and vomiting No date: Thyroid disease No date: Vertigo     Comment:  with sinus issues.  none since 01/17 Past Surgical History: 09/15/1988: ABDOMINAL HYSTERECTOMY 2002: BREAST LUMPECTOMY; Left No date: CARPAL TUNNEL RELEASE; Right No date: CHOLECYSTECTOMY 11/11/2017: COLONOSCOPY WITH PROPOFOL; N/A     Comment:  Procedure: COLONOSCOPY WITH PROPOFOL;  Surgeon:               Lollie Sails, MD;  Location: [facility] ENDOSCOPY;                Service: Endoscopy;  Laterality: N/A; 11/11/2017: ESOPHAGOGASTRODUODENOSCOPY (EGD) WITH PROPOFOL; N/A     Comment:   Procedure: ESOPHAGOGASTRODUODENOSCOPY (EGD) WITH               PROPOFOL;  Surgeon: Lollie Sails, MD;  Location:               [facility] ENDOSCOPY;  Service: Endoscopy;  Laterality: N/A; 01/03/2010: FOOT SURGERY 07/21/2010: GASTROPLASTY     Comment:  Lap nissan 2015: KNEE ARTHROSCOPY; Right 03/08/2016: KNEE ARTHROSCOPY WITH LATERAL MENISECTOMY; Right     Comment:  Procedure: KNEE ARTHROSCOPY WITH PARTIAL LATERAL               MENISECTOMY;  Surgeon: Ninetta Lights, MD;  Location:               [place];  Service: Orthopedics;                Laterality: Right; 03/08/2016: KNEE ARTHROSCOPY WITH MEDIAL MENISECTOMY; Right     Comment:  Procedure: RIGHT KNEE ARTHROSCOPY CHONDROPLASTY WITH               MEDIAL MENISCECTOMY;  Surgeon: Ninetta Lights, MD;                Location: [place];  Service:               Orthopedics;  Laterality: Right; No date: nissen fundoplication with gastroplasty 07/19/1988: OVARY SURGERY     Comment:  Removal 1996: SHOULDER SURGERY; Left No date: TONSILLECTOMY BMI    Body Mass Index:  37.41 kg/m     Reproductive/Obstetrics negative OB ROS  Anesthesia Physical Anesthesia Plan  ASA: III  Anesthesia Plan: General   Post-op Pain Management:    Induction:   PONV Risk Score and Plan:   Airway Management Planned:   Additional Equipment:   Intra-op Plan:   Post-operative Plan:   Informed Consent: I have reviewed the patients History and Physical, chart, labs and discussed the procedure including the risks, benefits and alternatives for the proposed anesthesia with the patient or authorized representative who has indicated his/her understanding and acceptance.   Dental Advisory Given  Plan Discussed with: CRNA  Anesthesia Plan Comments:         Anesthesia Quick Evaluation

## 2018-07-03 NOTE — Anesthesia Postprocedure Evaluation (Signed)
Anesthesia Post Note  Patient: Tamara Williams  Procedure(s) Performed: COLONOSCOPY WITH PROPOFOL (N/A )  Patient location during evaluation: Endoscopy Anesthesia Type: General Level of consciousness: awake and alert Pain management: pain level controlled Vital Signs Assessment: post-procedure vital signs reviewed and stable Respiratory status: spontaneous breathing, nonlabored ventilation, respiratory function stable and patient connected to nasal cannula oxygen Cardiovascular status: blood pressure returned to baseline and stable Postop Assessment: no apparent nausea or vomiting Anesthetic complications: no     Last Vitals:  Vitals:   07/03/18 1046 07/03/18 1048  BP: 106/80 102/80  Pulse: 76 77  Resp: 16 18  Temp: (!) 36.2 C   SpO2: 100% 99%    Last Pain:  Vitals:   07/03/18 1048  TempSrc:   PainSc: 0-No pain                 Molli Barrows

## 2018-07-03 NOTE — H&P (Signed)
Outpatient short stay form Pre-procedure 07/03/2018 10:00 AM Tamara Sails MD  Primary Physician: Wende Neighbors, MD  Reason for visit: Colonoscopy  History of present illness: Patient is a 67 year old female presenting today for colonoscopy in regards to Hemoccult positive stool.  She had an attempted colonoscopies in February 2019 however she had a very bad prep at the time it was a incomplete colonoscopy.  She is taking Amitiza currently and states this works well for her constipation.  She denies any blood in the stool.  She does get some lower abdominal pain.  This seems to be relieved with a bowel movement.  Takes no aspirin or blood thinning agent with the exception of 81 mg aspirin that she is currently off.  She tolerated her prep well and states she is clear today.    Current Facility-Administered Medications:  .  0.9 %  sodium chloride infusion, , Intravenous, Continuous, Tamara Sails, MD, Last Rate: 20 mL/hr at 07/03/18 0939, 1,000 mL at 07/03/18 9562  Medications Prior to Admission  Medication Sig Dispense Refill Last Dose  . aspirin 81 MG tablet Take 81 mg by mouth daily.   07/02/2018 at Unknown time  . budesonide (PULMICORT) 0.5 MG/2ML nebulizer solution 2 (two) times daily.    Past Week at Unknown time  . buPROPion (WELLBUTRIN SR) 150 MG 12 hr tablet Take 150 mg by mouth Twice daily.    07/02/2018 at Unknown time  . Calcium Carbonate-Vitamin D (CALCIUM + D PO) Take by mouth 2 (two) times daily.   Past Week at Unknown time  . celecoxib (CELEBREX) 100 MG capsule Take 100 mg by mouth 2 (two) times daily.   Past Week at Unknown time  . citalopram (CELEXA) 20 MG tablet Take 20 mg by mouth daily.   07/02/2018 at Unknown time  . DEXILANT 60 MG capsule Take 60 mg by mouth daily.    Past Week at Unknown time  . diclofenac sodium (VOLTAREN) 1 % GEL APPLY 2 TO 4 GRAMS TO EACH KNEE UP TO QID  1 Past Week at Unknown time  . ipratropium (ATROVENT) 0.06 % nasal spray Place into the nose.    Past Week at Unknown time  . levothyroxine (SYNTHROID, LEVOTHROID) 25 MCG tablet Take by mouth.   07/02/2018 at Unknown time  . lovastatin (MEVACOR) 40 MG tablet Take by mouth.   Past Week at Unknown time  . lubiprostone (AMITIZA) 8 MCG capsule Take 8 mcg by mouth 2 (two) times daily with a meal.   Past Week at Unknown time  . montelukast (SINGULAIR) 10 MG tablet Take 10 mg by mouth every morning.    Past Week at Unknown time  . ondansetron (ZOFRAN-ODT) 4 MG disintegrating tablet Take 4 mg by mouth every 12 (twelve) hours as needed.    Past Week at Unknown time  . ranitidine (ZANTAC) 150 MG tablet TAKE ONE TABLET BY MOUTH EVERY NIGHT AT BEDTIME   Past Week at Unknown time  . traZODone (DESYREL) 50 MG tablet Take 50 mg by mouth at bedtime.   Past Week at Unknown time  . VENTOLIN HFA 108 (90 BASE) MCG/ACT inhaler Inhale 1 puff into the lungs as needed.    Past Week at Unknown time  . vitamin C (ASCORBIC ACID) 500 MG tablet Take 500 mg by mouth daily.   Past Week at Unknown time  . Vitamin D, Ergocalciferol, (DRISDOL) 50000 units CAPS capsule TK 1 C PO Q 7 DAYS FOR 12 DOSES  0  Past Week at Unknown time     Allergies  Allergen Reactions  . Amoxicillin-Pot Clavulanate Nausea And Vomiting    vomiting  . Biaxin [Clarithromycin] Nausea Only  . Demerol [Meperidine] Itching  . Sulfamethoxazole-Trimethoprim Itching and Nausea And Vomiting  . Versed [Midazolam]   . Oxycodone Rash and Itching    itching  . Percocet [Oxycodone-Acetaminophen]     itch  . Tetracyclines & Related Rash    itch     Past Medical History:  Diagnosis Date  . Anemia   . Anxiety   . Arthritis   . Asthma   . Chronic sciatica   . Constipation   . Degenerative disc disease   . Depression   . GERD (gastroesophageal reflux disease)   . Heart murmur   . Hypothyroidism   . Irritable bowel syndrome (IBS)   . Nausea and vomiting   . Thyroid disease   . Vertigo    with sinus issues.  none since 01/17    Review of  systems:      Physical Exam    Heart and lungs: Without rub or gallop, lungs are bilaterally clear.    HEENT: Normocephalic atraumatic eyes are anicteric    Other:    Pertinant exam for procedure: Soft nontender nondistended bowel sounds positive normoactive.    Planned proceedures: Colonoscopy and indicated procedures. I have discussed the risks benefits and complications of procedures to include not limited to bleeding, infection, perforation and the risk of sedation and the patient wishes to proceed.    Tamara Sails, MD Gastroenterology 07/03/2018  10:00 AM

## 2018-07-05 ENCOUNTER — Encounter: Payer: Self-pay | Admitting: Gastroenterology

## 2018-07-07 LAB — SURGICAL PATHOLOGY

## 2018-07-21 ENCOUNTER — Ambulatory Visit: Payer: Medicare Other | Admitting: Podiatry

## 2018-07-28 ENCOUNTER — Ambulatory Visit: Payer: Medicare Other | Admitting: Podiatry

## 2018-11-19 ENCOUNTER — Encounter: Payer: Self-pay | Admitting: Cardiology

## 2018-11-19 DIAGNOSIS — M5430 Sciatica, unspecified side: Secondary | ICD-10-CM

## 2018-11-19 DIAGNOSIS — F329 Major depressive disorder, single episode, unspecified: Secondary | ICD-10-CM | POA: Insufficient documentation

## 2018-11-19 DIAGNOSIS — I340 Nonrheumatic mitral (valve) insufficiency: Secondary | ICD-10-CM | POA: Insufficient documentation

## 2018-11-19 DIAGNOSIS — E039 Hypothyroidism, unspecified: Secondary | ICD-10-CM | POA: Insufficient documentation

## 2018-11-19 DIAGNOSIS — E559 Vitamin D deficiency, unspecified: Secondary | ICD-10-CM | POA: Insufficient documentation

## 2018-11-19 DIAGNOSIS — K589 Irritable bowel syndrome without diarrhea: Secondary | ICD-10-CM | POA: Insufficient documentation

## 2018-11-19 DIAGNOSIS — F32A Depression, unspecified: Secondary | ICD-10-CM

## 2018-11-19 DIAGNOSIS — F419 Anxiety disorder, unspecified: Secondary | ICD-10-CM

## 2018-11-19 DIAGNOSIS — R9431 Abnormal electrocardiogram [ECG] [EKG]: Secondary | ICD-10-CM

## 2018-11-19 DIAGNOSIS — J45909 Unspecified asthma, uncomplicated: Secondary | ICD-10-CM | POA: Insufficient documentation

## 2018-11-19 DIAGNOSIS — E7800 Pure hypercholesterolemia, unspecified: Secondary | ICD-10-CM

## 2018-11-19 HISTORY — DX: Pure hypercholesterolemia, unspecified: E78.00

## 2018-11-19 HISTORY — DX: Sciatica, unspecified side: M54.30

## 2018-11-19 HISTORY — DX: Anxiety disorder, unspecified: F41.9

## 2018-11-19 HISTORY — DX: Abnormal electrocardiogram (ECG) (EKG): R94.31

## 2018-11-19 HISTORY — DX: Depression, unspecified: F32.A

## 2018-11-19 NOTE — Progress Notes (Signed)
Subjective:  Primary Physician:  Celene Squibb, MD  Patient ID: Tamara Williams, female    DOB: [birth year], 68 y.o.   MRN: [id number]  Chief Complaint  Patient presents with  . Mitral Regurgitation  . Follow-up    HPI: Tamara Williams  is a 68 y.o. female  with a medical history significant for hyperlipidemia, hypothyroidism, asthma, anxiety, GERD, and abnormal EKG who presents to the clinic for a 2 year follow-up for mitral regurgitation. 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 She has not been able to tolerate Crestor due to myalgias and was prescribed pravastatin which also worsened her symptoms and she discontinued this. She is currently on lovastatin with no issues. No history of hypertension or diabetes. No history of TIA or CVA.    Patient states she is doing well with no new symptoms. Her most recent lab work was performed 5 months ago at her PCP. She denies shortness of breath, chest pain, edema, or symptoms of claudication. She has chronic knee pain.   Past Medical History:  Diagnosis Date  . Abnormal EKG 11/19/2018  . Anemia   . Anxiety   . Anxiety and depression 11/19/2018  . Arthritis   . Asthma   . Chronic sciatica   . Constipation   . Degenerative disc disease   . Depression   . GERD (gastroesophageal reflux disease)   . Heart murmur   . Hypercholesterolemia 11/19/2018  . Hypothyroidism   . Irritable bowel syndrome (IBS)   . Nausea and vomiting   . Sciatica 11/19/2018  . Thyroid disease   . Vertigo    with sinus issues.  none since 01/17    Past Surgical History:  Procedure Laterality Date  . ABDOMINAL HYSTERECTOMY  09/15/1988  . BREAST LUMPECTOMY Left 2002  . CARPAL TUNNEL RELEASE Right   . CHOLECYSTECTOMY    . COLONOSCOPY WITH PROPOFOL N/A 11/11/2017   Procedure: COLONOSCOPY WITH PROPOFOL;  Surgeon: Lollie Sails,  MD;  Location: [facility] ENDOSCOPY;  Service: Endoscopy;  Laterality: N/A;  . COLONOSCOPY WITH PROPOFOL N/A 07/03/2018   Procedure: COLONOSCOPY WITH PROPOFOL;  Surgeon: Lollie Sails, MD;  Location: [facility] ENDOSCOPY;  Service: Endoscopy;  Laterality: N/A;  . ESOPHAGOGASTRODUODENOSCOPY (EGD) WITH PROPOFOL N/A 11/11/2017   Procedure: ESOPHAGOGASTRODUODENOSCOPY (EGD) WITH PROPOFOL;  Surgeon: Lollie Sails, MD;  Location: [facility] ENDOSCOPY;  Service: Endoscopy;  Laterality: N/A;  . FOOT SURGERY  01/03/2010  . GASTROPLASTY  07/21/2010   Lap nissan  . KNEE ARTHROSCOPY Right 2015  . KNEE ARTHROSCOPY WITH LATERAL MENISECTOMY Right 03/08/2016   Procedure: KNEE ARTHROSCOPY WITH PARTIAL LATERAL MENISECTOMY;  Surgeon: Ninetta Lights, MD;  Location: [place];  Service: Orthopedics;  Laterality: Right;  . KNEE ARTHROSCOPY WITH MEDIAL MENISECTOMY Right 03/08/2016   Procedure: RIGHT KNEE ARTHROSCOPY CHONDROPLASTY WITH MEDIAL MENISCECTOMY;  Surgeon: Ninetta Lights, MD;  Location: [place];  Service: Orthopedics;  Laterality: Right;  . nissen fundoplication with gastroplasty    . OVARY SURGERY  07/19/1988   Removal  . SHOULDER SURGERY Left 1996  . TONSILLECTOMY      Social History   Socioeconomic History  . Marital status: Married    Spouse name: Not on file  . Number of children: 2  . Years of education: Not on file  . Highest education level: Not on file  Occupational History  .  Not on file  Social Needs  . Financial resource strain: Not on file  . Food insecurity:    Worry: Not on file    Inability: Not on file  . Transportation needs:    Medical: Not on file    Non-medical: Not on file  Tobacco Use  . Smoking status: Never Smoker  . Smokeless tobacco: Never Used  Substance and Sexual Activity  . Alcohol use: Yes    Alcohol/week: 3.0 standard drinks    Types: 3 Glasses of wine per week    Comment: social  . Drug use: No  . Sexual activity: Not on file    Lifestyle  . Physical activity:    Days per week: Not on file    Minutes per session: Not on file  . Stress: Not on file  Relationships  . Social connections:    Talks on phone: Not on file    Gets together: Not on file    Attends religious service: Not on file    Active member of club or organization: Not on file    Attends meetings of clubs or organizations: Not on file    Relationship status: Not on file  . Intimate partner violence:    Fear of current or ex partner: Not on file    Emotionally abused: Not on file    Physically abused: Not on file    Forced sexual activity: Not on file  Other Topics Concern  . Not on file  Social History Narrative  . Not on file    Current Outpatient Medications on File Prior to Visit  Medication Sig Dispense Refill  . aspirin 81 MG tablet Take 81 mg by mouth daily.    . budesonide (PULMICORT) 0.5 MG/2ML nebulizer solution 2 (two) times daily.     [place] Kitchen buPROPion (WELLBUTRIN SR) 150 MG 12 hr tablet Take 150 mg by mouth daily.     . celecoxib (CELEBREX) 100 MG capsule Take 100 mg by mouth as needed.     . citalopram (CELEXA) 20 MG tablet Take 20 mg by mouth daily.    [place] Kitchen DEXILANT 60 MG capsule Take 60 mg by mouth daily.     [place] Kitchen ipratropium (ATROVENT) 0.06 % nasal spray Place into the nose.    . levothyroxine (SYNTHROID, LEVOTHROID) 25 MCG tablet Take by mouth.    . lovastatin (MEVACOR) 40 MG tablet Take by mouth.    . lubiprostone (AMITIZA) 8 MCG capsule Take 24 mcg by mouth 2 (two) times daily with a meal.     . montelukast (SINGULAIR) 10 MG tablet Take 10 mg by mouth every morning.     . ondansetron (ZOFRAN-ODT) 4 MG disintegrating tablet Take 4 mg by mouth every 12 (twelve) hours as needed.     . traZODone (DESYREL) 50 MG tablet Take 50 mg by mouth as needed.     . VENTOLIN HFA 108 (90 BASE) MCG/ACT inhaler Inhale 1 puff into the lungs as needed.     . vitamin C (ASCORBIC ACID) 500 MG tablet Take 1,200 mg by mouth daily.     . Vitamin D,  Ergocalciferol, (DRISDOL) 50000 units CAPS capsule daily.   0  . Calcium Carbonate-Vitamin D (CALCIUM + D PO) Take by mouth 2 (two) times daily.    . diclofenac sodium (VOLTAREN) 1 % GEL APPLY 2 TO 4 GRAMS TO EACH KNEE UP TO QID  1  . ranitidine (ZANTAC) 150 MG tablet TAKE ONE TABLET BY MOUTH EVERY NIGHT AT  BEDTIME     No current facility-administered medications on file prior to visit.    Review of Systems  Constitutional: Negative for diaphoresis, malaise/fatigue and weight loss.  Respiratory: Negative for hemoptysis, shortness of breath and wheezing.   Cardiovascular: Negative for chest pain, palpitations, orthopnea, claudication and leg swelling.  Gastrointestinal: Negative for abdominal pain, constipation and nausea.  Genitourinary: Negative for dysuria.  Musculoskeletal: Positive for joint pain (chronic knee pain).  Neurological: Negative for dizziness, loss of consciousness and weakness.  Endo/Heme/Allergies: Does not bruise/bleed easily.  Psychiatric/Behavioral: Negative for depression. The patient is not nervous/anxious.        Objective:  Blood pressure 118/60, pulse 75, height 5\' 1"  (1.549 m), weight 94.7 kg, SpO2 98 %. Body mass index is 39.45 kg/m.  Physical Exam  Constitutional: She is oriented to person, place, and time. Vital signs are normal. She appears well-developed and well-nourished.  HENT:  Head: Normocephalic and atraumatic.  Neck: Normal range of motion.  Cardiovascular: Normal rate, regular rhythm, intact distal pulses and normal pulses.  Murmur (2/6 midsystolic murmur at the apex.) heard. Pulmonary/Chest: Effort normal and breath sounds normal. No accessory muscle usage. No respiratory distress.  Abdominal: Soft. Bowel sounds are normal.  Musculoskeletal: Normal range of motion.  Neurological: She is alert and oriented to person, place, and time.  Skin: Skin is warm and dry.  Vitals reviewed.  CARDIAC STUDIES:   Echocardiogram 10/09/2018: Left  ventricle cavity is normal in size. Normal left ventricular shape. Normal global wall motion. Doppler evidence of grade I (impaired) diastolic dysfunction, normal LAP. Calculated EF 68%. Mild aortic valve leaflet thickening. Mild aortic stenosis. Aortic valve mean gradient of 7 mmHg, Vmax of 1.8 m/s. Calculated aortic valve area by continuity equation is 1.7 cm. Mild to moderate mitral regurgitation. Mild pulmonary hypertension with estimated pulmonary artery systolic pressure 35 mmHg. Compared to previous study on 09/19/2016, mild aortic stenosis and mild pulmonary hypertension are new findings.  Nuclear stress test 09/19/2016: 1. Resting EKG demonstrates normal sinus rhythm, normal axis, poor R-wave progression. Stress EKG is non-diagnostic for ischemia as it a pharmacologic stress using Lexiscan.Stress symptoms included dyspnea. 2. Myocardial perfusion imaging is normal. Overall left ventricular systolic function was normal without regional wall motion abnormalities. The left ventricular ejection fraction was 74%.  Colonoscopy 2014: Normal.  Endoscopy 2014: Normal.  Sleep Study 2011: Neg for Sleep Apnea  Assessment & Recommendations:   1. Moderate mitral regurgitation by prior echocardiogram  2. Hypercholesterolemia EKG 11/20/2018: Normal sinus rhythm at rate of 73 bpm, normal axis. Incomplete right bundle branch block. Otherwise normal EKG.  3.  Moderate, nearly morbid obesity due to excessive calories.  Recommendation: Patient is very mild aortic stenosis and mild to moderate mitral regurgitation but clinically stable.  I do not think she needs any further evaluation for now, she could potentially follow-up with her PCP but she would like to continue to have a clinical relationship with [place], hence I'll like to see her back in 2 years.  With regard to hyperlipidemia, patient states that her lipids are very well controlled.  She is now tolerating lovastatin without any side effects of  myalgia.  Obesity was discussed, discussed regarding weight loss and reducing calorie intake and also to increase her physical activity.  No changes in the medications were done today.   11/20/2018, 1:05 PM Adrian Prows, MD, [facility] 11/21/2018, 10:45 AM [facility] [place] Pager: [phone] Office: [phone] If no answer Cell [phone]

## 2018-11-20 ENCOUNTER — Encounter: Payer: Self-pay | Admitting: Cardiology

## 2018-11-20 ENCOUNTER — Ambulatory Visit (INDEPENDENT_AMBULATORY_CARE_PROVIDER_SITE_OTHER): Payer: Medicare Other | Admitting: Cardiology

## 2018-11-20 VITALS — BP 118/60 | HR 75 | Ht 61.0 in | Wt 208.8 lb

## 2018-11-20 DIAGNOSIS — Z6839 Body mass index (BMI) 39.0-39.9, adult: Secondary | ICD-10-CM | POA: Diagnosis not present

## 2018-11-20 DIAGNOSIS — E6609 Other obesity due to excess calories: Secondary | ICD-10-CM | POA: Diagnosis not present

## 2018-11-20 DIAGNOSIS — E7800 Pure hypercholesterolemia, unspecified: Secondary | ICD-10-CM | POA: Diagnosis not present

## 2018-11-20 DIAGNOSIS — I340 Nonrheumatic mitral (valve) insufficiency: Secondary | ICD-10-CM | POA: Diagnosis not present

## 2018-11-21 ENCOUNTER — Encounter: Payer: Self-pay | Admitting: Cardiology

## 2018-11-29 ENCOUNTER — Ambulatory Visit: Admit: 2018-11-29 | Discharge status: Absent without leave | Payer: Medicare Other

## 2018-12-31 ENCOUNTER — Other Ambulatory Visit: Payer: Self-pay | Admitting: Internal Medicine

## 2018-12-31 ENCOUNTER — Other Ambulatory Visit: Payer: Self-pay

## 2018-12-31 ENCOUNTER — Ambulatory Visit
Admission: RE | Admit: 2018-12-31 | Discharge: 2018-12-31 | Disposition: A | Discharge status: Home / Self Care | Payer: Medicare Other | Source: Ambulatory Visit | Attending: Dermatology | Admitting: Dermatology

## 2018-12-31 ENCOUNTER — Ambulatory Visit
Admission: RE | Admit: 2018-12-31 | Discharge: 2018-12-31 | Disposition: A | Discharge status: Home / Self Care | Payer: Medicare Other | Source: Ambulatory Visit | Attending: Internal Medicine | Admitting: Internal Medicine

## 2018-12-31 DIAGNOSIS — R059 Cough, unspecified: Secondary | ICD-10-CM

## 2018-12-31 DIAGNOSIS — R05 Cough: Secondary | ICD-10-CM

## 2019-03-24 ENCOUNTER — Other Ambulatory Visit: Payer: Self-pay | Admitting: Internal Medicine

## 2019-03-24 DIAGNOSIS — I9589 Other hypotension: Secondary | ICD-10-CM

## 2019-04-02 ENCOUNTER — Other Ambulatory Visit (HOSPITAL_COMMUNITY): Payer: Self-pay | Admitting: Adult Health Nurse Practitioner

## 2019-04-02 DIAGNOSIS — R031 Nonspecific low blood-pressure reading: Secondary | ICD-10-CM

## 2019-04-06 ENCOUNTER — Ambulatory Visit (HOSPITAL_COMMUNITY): Payer: Medicare Other

## 2019-04-13 ENCOUNTER — Ambulatory Visit (HOSPITAL_COMMUNITY): Admission: RE | Admit: 2019-04-13 | Payer: Medicare Other | Source: Ambulatory Visit

## 2019-04-27 ENCOUNTER — Other Ambulatory Visit: Payer: Self-pay

## 2019-04-27 ENCOUNTER — Ambulatory Visit (HOSPITAL_COMMUNITY)
Admission: RE | Admit: 2019-04-27 | Discharge: 2019-04-27 | Disposition: A | Discharge status: Home / Self Care | Payer: Medicare Other | Source: Ambulatory Visit | Attending: Adult Health Nurse Practitioner | Admitting: Adult Health Nurse Practitioner

## 2019-04-27 DIAGNOSIS — R031 Nonspecific low blood-pressure reading: Secondary | ICD-10-CM | POA: Insufficient documentation

## 2019-04-27 NOTE — Progress Notes (Signed)
Bilateral carotid duplex completed. Preliminary results in Chart review CV Proc. [facility], [place] 04/27/2019 4:09 PM

## 2019-07-22 ENCOUNTER — Other Ambulatory Visit (HOSPITAL_COMMUNITY): Payer: Self-pay | Admitting: Internal Medicine

## 2019-07-22 ENCOUNTER — Other Ambulatory Visit: Payer: Self-pay | Admitting: Internal Medicine

## 2019-07-22 ENCOUNTER — Other Ambulatory Visit: Payer: Self-pay

## 2019-07-22 ENCOUNTER — Ambulatory Visit
Admission: RE | Admit: 2019-07-22 | Discharge: 2019-07-22 | Disposition: A | Discharge status: Home / Self Care | Payer: Medicare Other | Source: Ambulatory Visit | Attending: Internal Medicine | Admitting: Internal Medicine

## 2019-07-22 DIAGNOSIS — R05 Cough: Secondary | ICD-10-CM

## 2019-07-22 DIAGNOSIS — R0602 Shortness of breath: Secondary | ICD-10-CM | POA: Insufficient documentation

## 2019-07-22 DIAGNOSIS — R059 Cough, unspecified: Secondary | ICD-10-CM

## 2019-08-31 ENCOUNTER — Other Ambulatory Visit: Payer: Self-pay

## 2019-08-31 ENCOUNTER — Ambulatory Visit (INDEPENDENT_AMBULATORY_CARE_PROVIDER_SITE_OTHER): Payer: Medicare Other | Admitting: Podiatry

## 2019-08-31 DIAGNOSIS — L989 Disorder of the skin and subcutaneous tissue, unspecified: Secondary | ICD-10-CM

## 2019-08-31 DIAGNOSIS — M79674 Pain in right toe(s): Secondary | ICD-10-CM

## 2019-09-03 NOTE — Progress Notes (Signed)
   Subjective: 68 y.o. female presenting to the office today with a chief complaint of a possible corn on the right fifth toe that appeared about one week ago. She reports associated burning sensation caused by the area. Walking increases the pain. She has used OTC corn removal and cushioning for treatment. Patient is here for further evaluation and treatment.   Past Medical History:  Diagnosis Date  . Abnormal EKG 11/19/2018  . Anemia   . Anxiety   . Anxiety and depression 11/19/2018  . Arthritis   . Asthma   . Chronic sciatica   . Constipation   . Degenerative disc disease   . Depression   . GERD (gastroesophageal reflux disease)   . Heart murmur   . Hypercholesterolemia 11/19/2018  . Hypothyroidism   . Irritable bowel syndrome (IBS)   . Nausea and vomiting   . Sciatica 11/19/2018  . Thyroid disease   . Vertigo    with sinus issues.  none since 01/17     Objective:  Physical Exam General: Alert and oriented x3 in no acute distress  Dermatology: Hyperkeratotic lesion(s) present on the right fourth toe. Pain on palpation with a central nucleated core noted. Skin is warm, dry and supple bilateral lower extremities. Negative for open lesions or macerations.  Vascular: Palpable pedal pulses bilaterally. No edema or erythema noted. Capillary refill within normal limits.  Neurological: Epicritic and protective threshold grossly intact bilaterally.   Musculoskeletal Exam: Pain on palpation at the keratotic lesion(s) noted. Range of motion within normal limits bilateral. Muscle strength 5/5 in all groups bilateral.  Assessment: 1. Corn right fourth toe   Plan of Care:  1. Patient evaluated 2. Excisional debridement of keratoic lesion(s) using a chisel blade was performed without incident.  3. Dressed area with light dressing. 4. Recommended antibiotic ointment daily with a bandage. 5. Recommended wide fitting shoes.  6. Patient is to return to the clinic PRN.   Goes by  Tamara Williams.   Edrick Kins, DPM [facility]  Dr. Edrick Kins, [address]                                        [place], [place] [address]                Office [phone]  Fax [phone]

## 2019-09-22 ENCOUNTER — Encounter: Payer: Self-pay | Admitting: Podiatry

## 2019-09-22 ENCOUNTER — Ambulatory Visit (INDEPENDENT_AMBULATORY_CARE_PROVIDER_SITE_OTHER): Payer: Medicare Other | Admitting: Podiatry

## 2019-09-22 ENCOUNTER — Other Ambulatory Visit: Payer: Self-pay

## 2019-09-22 ENCOUNTER — Ambulatory Visit (INDEPENDENT_AMBULATORY_CARE_PROVIDER_SITE_OTHER): Payer: Medicare Other

## 2019-09-22 ENCOUNTER — Other Ambulatory Visit: Payer: Self-pay | Admitting: Podiatry

## 2019-09-22 DIAGNOSIS — L989 Disorder of the skin and subcutaneous tissue, unspecified: Secondary | ICD-10-CM | POA: Diagnosis not present

## 2019-09-22 DIAGNOSIS — M778 Other enthesopathies, not elsewhere classified: Secondary | ICD-10-CM

## 2019-09-22 DIAGNOSIS — M2041 Other hammer toe(s) (acquired), right foot: Secondary | ICD-10-CM | POA: Diagnosis not present

## 2019-09-22 NOTE — Patient Instructions (Signed)
Pre-Operative Instructions  Congratulations, you have decided to take an important step towards improving your quality of life.  You can be assured that the doctors and staff at [facility] will be with you every step of the way.  Here are some important things you should know:  1. Plan to be at the surgery center/hospital at least 1 (one) hour prior to your scheduled time, unless otherwise directed by the surgical center/hospital staff.  You must have a responsible adult accompany you, remain during the surgery and drive you home.  Make sure you have directions to the surgical center/hospital to ensure you arrive on time. 2. If you are having surgery at [facility] or [facility], you will need a copy of your medical history and physical form from your family physician within one month prior to the date of surgery. We will give you a form for your primary physician to complete.  3. We make every effort to accommodate the date you request for surgery.  However, there are times where surgery dates or times have to be moved.  We will contact you as soon as possible if a change in schedule is required.   4. No aspirin/ibuprofen for one week before surgery.  If you are on aspirin, any non-steroidal anti-inflammatory medications (Mobic, Aleve, Ibuprofen) should not be taken seven (7) days prior to your surgery.  You make take Tylenol for pain prior to surgery.  5. Medications - If you are taking daily heart and blood pressure medications, seizure, reflux, allergy, asthma, anxiety, pain or diabetes medications, make sure you notify the surgery center/hospital before the day of surgery so they can tell you which medications you should take or avoid the day of surgery. 6. No food or drink after midnight the night before surgery unless directed otherwise by surgical center/hospital staff. 7. No alcoholic beverages 24-hours prior to surgery.  No smoking 24-hours prior or 24-hours after  surgery. 8. Wear loose pants or shorts. They should be loose enough to fit over bandages, boots, and casts. 9. Don't wear slip-on shoes. Sneakers are preferred. 10. Bring your boot with you to the surgery center/hospital.  Also bring crutches or a walker if your physician has prescribed it for you.  If you do not have this equipment, it will be provided for you after surgery. 11. If you have not been contacted by the surgery center/hospital by the day before your surgery, call to confirm the date and time of your surgery. 12. Leave-time from work may vary depending on the type of surgery you have.  Appropriate arrangements should be made prior to surgery with your employer. 13. Prescriptions will be provided immediately following surgery by your doctor.  Fill these as soon as possible after surgery and take the medication as directed. Pain medications will not be refilled on weekends and must be approved by the doctor. 14. Remove nail polish on the operative foot and avoid getting pedicures prior to surgery. 15. Wash the night before surgery.  The night before surgery wash the foot and leg well with water and the antibacterial soap provided. Be sure to pay special attention to beneath the toenails and in between the toes.  Wash for at least three (3) minutes. Rinse thoroughly with water and dry well with a towel.  Perform this wash unless told not to do so by your physician.  Enclosed: 1 Ice pack (please put in freezer the night before surgery)   1 Hibiclens skin cleaner     Pre-op instructions  If you have any questions regarding the instructions, please do not hesitate to call our office.  [place]: [address], [place], [place] [address] -- [phone]  [place]: [address] [place], [place] [address] -- [phone]  [place]: [address], [place],  [address] -- [phone]   Website: [email or web address] 

## 2019-09-23 ENCOUNTER — Telehealth: Payer: Self-pay | Admitting: *Deleted

## 2019-09-23 NOTE — Telephone Encounter (Signed)
"  I'm a patient of Dr. Amalia Hailey.  I saw him yesterday.  I was told to give you a call today to schedule my surgery.  Please give me a call."

## 2019-09-28 ENCOUNTER — Ambulatory Visit: Payer: Medicare Other | Admitting: Podiatry

## 2019-09-28 NOTE — Progress Notes (Signed)
   Subjective: 68 y.o. female presenting to the office today for follow up evaluation of a corn noted to the right fourth digit. She reports continued pain to the dorsal foot. She reports associated swelling of the toe that occurs at night. She has been applying antibiotic ointment as directed. Patient is here for further evaluation and treatment.    Past Medical History:  Diagnosis Date  . Abnormal EKG 11/19/2018  . Anemia   . Anxiety   . Anxiety and depression 11/19/2018  . Arthritis   . Asthma   . Chronic sciatica   . Constipation   . Degenerative disc disease   . Depression   . GERD (gastroesophageal reflux disease)   . Heart murmur   . Hypercholesterolemia 11/19/2018  . Hypothyroidism   . Irritable bowel syndrome (IBS)   . Nausea and vomiting   . Sciatica 11/19/2018  . Thyroid disease   . Vertigo    with sinus issues.  none since 01/17     Objective:  Physical Exam General: Alert and oriented x3 in no acute distress  Dermatology: Hyperkeratotic lesion(s) present on the right fourth toe. Pain on palpation with a central nucleated core noted. Skin is warm, dry and supple bilateral lower extremities. Negative for open lesions or macerations.  Vascular: Palpable pedal pulses bilaterally. No edema or erythema noted. Capillary refill within normal limits.  Neurological: Epicritic and protective threshold grossly intact bilaterally.   Musculoskeletal Exam: Pain on palpation at the keratotic lesion(s) noted. Hammertoe contracture deformity noted to the 4th digit of the right foot. Range of motion within normal limits bilateral. Muscle strength 5/5 in all groups bilateral.  Radiographic Exam: Hammertoe contracture deformity noted to the interphalangeal joints and MPJ of the respective hammertoe digits mentioned on clinical musculoskeletal exam.   Assessment: 1. Corn right fourth toe 2. Hammertoe contracture right fourth toe   Plan of Care:  1. Patient evaluated. X-Rays  reviewed.  2. Excisional debridement of keratoic lesion(s) using a chisel blade was performed without incident.  3. Dressed area with light dressing. 4. Today we discussed the conservative versus surgical management of the presenting pathology. The patient opts for surgical management. All possible complications and details of the procedure were explained. All patient questions were answered. No guarantees were expressed or implied. 5. Authorization for surgery was initiated today. In-office surgery will consist of PIPJ arthroplasty 4th right digit.  6. Return to clinic morning of in-office surgery.     Goes by Sula Soda.   Edrick Kins, DPM [facility]  Dr. Edrick Kins, [place]                                        [place], [place] [address]                Office [phone]  Fax [phone]

## 2019-09-28 NOTE — Telephone Encounter (Signed)
"  I saw Dr. Amalia Hailey in [place] on Tuesday of last week.  I'm trying to set up an appointment to have my toe fixed.  I'd appreciate it if you would call me back.  Call my home number.  Do not call my cell number.  Thank you."  I am returning your call.  You want to schedule your office surgery?  "Yes, I do."  Dr. Amalia Hailey can do it on January 6 or 13, 2021.  "Let's schedule it for January 6."  I'll schedule it.  You need to be here at the [facility] location at 11:45 am.  "Okay, sounds good.  Thank you so much."

## 2019-10-03 ENCOUNTER — Other Ambulatory Visit: Payer: Self-pay

## 2019-10-03 ENCOUNTER — Ambulatory Visit
Admission: EM | Admit: 2019-10-03 | Discharge: 2019-10-03 | Disposition: A | Discharge status: Home / Self Care | Payer: Medicare Other | Attending: Emergency Medicine | Admitting: Emergency Medicine

## 2019-10-03 DIAGNOSIS — Z20822 Contact with and (suspected) exposure to covid-19: Secondary | ICD-10-CM | POA: Diagnosis not present

## 2019-10-03 MED ORDER — BENZONATATE 100 MG PO CAPS: 100.0000 mg | ORAL_CAPSULE | Freq: Three times a day (TID) | ORAL | 0 refills | Status: DC

## 2019-10-03 MED ORDER — ONDANSETRON HCL 4 MG PO TABS: 4.0000 mg | ORAL_TABLET | Freq: Three times a day (TID) | ORAL | 0 refills | Status: DC | PRN

## 2019-10-03 MED ORDER — FLUTICASONE PROPIONATE 50 MCG/ACT NA SUSP: 1.0000 | Freq: Every day | NASAL | 0 refills | Status: DC

## 2019-10-03 MED ORDER — ALBUTEROL SULFATE HFA 108 (90 BASE) MCG/ACT IN AERS: 1.0000 | INHALATION_SPRAY | Freq: Four times a day (QID) | RESPIRATORY_TRACT | 0 refills | Status: DC | PRN

## 2019-10-03 NOTE — ED Triage Notes (Signed)
Pt presents to UC w/ c/o nausea, vomiting, headache, chills, sore throat, mild coughing x 3 days. Hx of asthma

## 2019-10-03 NOTE — ED Provider Notes (Signed)
[facility]    CSN: [id number] Arrival date & time: 10/03/19  1112      History   Chief Complaint Chief Complaint  Patient presents with  . Sore Throat    HPI Tamara Williams is a 69 y.o. female.   Tamara Williams 69 years old female presented to the urgent care for complaint of chills, headaches, sore throat, cough, nausea, and vomiting for the past 3 days.  Denies sick exposure to COVID, flu or strep.  Denies recent travel.  Denies aggravating or alleviating symptoms.  Denies previous COVID infection.   Denies fever,  fatigue, nasal congestion, rhinorrhea,   SOB, wheezing, chest pain,  changes in bowel or bladder habits.    The history is provided by the patient. No language interpreter was used.  Sore Throat Associated symptoms include headaches.    Past Medical History:  Diagnosis Date  . Abnormal EKG 11/19/2018  . Anemia   . Anxiety   . Anxiety and depression 11/19/2018  . Arthritis   . Asthma   . Chronic sciatica   . Constipation   . Degenerative disc disease   . Depression   . GERD (gastroesophageal reflux disease)   . Heart murmur   . Hypercholesterolemia 11/19/2018  . Hypothyroidism   . Irritable bowel syndrome (IBS)   . Nausea and vomiting   . Sciatica 11/19/2018  . Thyroid disease   . Vertigo    with sinus issues.  none since 01/17    Patient Active Problem List   Diagnosis Date Noted  . Moderate mitral regurgitation by prior echocardiogram 11/19/2018  . Hypercholesterolemia 11/19/2018  . Abnormal EKG 11/19/2018  . Asthma 11/19/2018  . Avitaminosis D 11/19/2018  . Sciatica 11/19/2018  . Hypothyroidism 11/19/2018  . Anxiety and depression 11/19/2018  . IBS (irritable bowel syndrome) 11/19/2018  . IDA (iron deficiency anemia) 07/27/2016  . 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 GERD (gastroesophageal reflux disease)-recurrent 03/02/2013  . Other iron deficiency anemia 10/05/2009  . OTHER ACQUIRED DEFORMITY OF ANKLE AND FOOT OTHER 10/05/2009    . Other acquired deformities of unspecified foot 10/05/2009  . Other hammer toe(s) (acquired), unspecified foot 10/05/2009  . METATARSALGIA 09/14/2009  . FOOT PAIN, BILATERAL 09/14/2009  . UNEQUAL LEG LENGTH 09/14/2009  . ABNORMALITY OF GAIT 09/14/2009  . Enthesopathy 09/14/2009    Past Surgical History:  Procedure Laterality Date  . ABDOMINAL HYSTERECTOMY  09/15/1988  . BREAST LUMPECTOMY Left 2002  . CARPAL TUNNEL RELEASE Right   . CHOLECYSTECTOMY    . COLONOSCOPY WITH PROPOFOL N/A 11/11/2017   Procedure: COLONOSCOPY WITH PROPOFOL;  Surgeon: Lollie Sails, MD;  Location[facility] END[facility]COPY;  Service: Endoscopy;  Laterality: N/A;  . COLONOSCOPY WITH PROPOFOL N/A 07/03/2018   Procedure: COLONOSCOPY WITH PROPOFOL;  Surgeon: Lollie Sails, MD;  Location[facility] END[facility]COPY;  Service: Endoscopy;  Laterality: N/A;  . ESOPHAGOGASTRODUODENOSCOPY (EGD) WITH PROPOFOL N/A 11/11/2017   Procedure: ESOPHAGOGASTRODUODENOSCOPY (EGD) WITH PROPOFOL;  Surgeon: Lollie Sails, MD;  Location: [facility] ENDOSCOPY;  Service: Endoscopy;  Laterality: N/A;  . FOOT SURGERY  01/03/2010  . GASTROPLASTY  07/21/2010   Lap nissan  . KNEE ARTHROSCOPY Right 2015  . KNEE ARTHROSCOPY WITH LATERAL MENISECTOMY Right 03/08/2016   Procedure: KNEE ARTHROSCOPY WITH PARTIAL LATERAL MENISECTOMY;  Surgeon: Ninetta Lights, MD;  Location: [place];  Service: Orthopedics;  Laterality: Right;  . KNEE ARTHROSCOPY WITH MEDIAL MENISECTOMY Right 03/08/2016   Procedure: RIGHT KNEE ARTHROSCOPY CHONDROPLASTY WITH MEDIAL MENISCECTOMY;  Surgeon: Quillian Quince  Dennie Bible, MD;  Location: [place];  Service: Orthopedics;  Laterality: Right;  . nissen fundoplication with gastroplasty    . OVARY SURGERY  07/19/1988   Removal  . SHOULDER SURGERY Left 1996  . TONSILLECTOMY      OB History   No obstetric history on file.      Home Medications    Prior to Admission medications   Medication Sig Start Date End Date  Taking? Authorizing Provider  albuterol (VENTOLIN HFA) 108 (90 Base) MCG/ACT inhaler Inhale 1-2 puffs into the lungs every 6 (six) hours as needed for wheezing or shortness of breath. 10/03/19   Jazariah Teall, Darrelyn Hillock, FNP  aspirin 81 MG tablet Take 81 mg by mouth daily.    [provider]  benzonatate (TESSALON) 100 MG capsule Take 1 capsule (100 mg total) by mouth every 8 (eight) hours. 10/03/19   Charity Tessier, Darrelyn Hillock, FNP  budesonide (PULMICORT) 0.5 MG/2ML nebulizer solution 2 (two) times daily.  10/22/11   [provider]  buPROPion (WELLBUTRIN SR) 150 MG 12 hr tablet Take 150 mg by mouth daily.  10/18/11   [provider]  Calcium Carbonate-Vitamin D (CALCIUM + D PO) Take by mouth 2 (two) times daily.    [provider]  celecoxib (CELEBREX) 100 MG capsule Take 100 mg by mouth as needed.     [provider]  citalopram (CELEXA) 20 MG tablet Take 20 mg by mouth daily.    [provider]  DEXILANT 60 MG capsule Take 60 mg by mouth daily.  04/23/12   [provider]  diclofenac sodium (VOLTAREN) 1 % GEL APPLY 2 TO 4 GRAMS TO EACH KNEE UP TO QID 06/06/18   [provider]  fluticasone (FLONASE) 50 MCG/ACT nasal spray Place 1 spray into both nostrils daily for 14 days. 10/03/19 10/17/19  Abdul Beirne, Darrelyn Hillock, FNP  hyoscyamine (LEVSIN) 0.125 MG tablet Take by mouth. 06/24/18   [provider]  ipratropium (ATROVENT) 0.06 % nasal spray Place into the nose.    [provider]  levothyroxine (SYNTHROID, LEVOTHROID) 25 MCG tablet Take by mouth.    [provider]  lovastatin (MEVACOR) 40 MG tablet Take by mouth.    [provider]  lubiprostone (AMITIZA) 8 MCG capsule Take 24 mcg by mouth 2 (two) times daily with a meal.     [provider]  montelukast (SINGULAIR) 10 MG tablet Take 10 mg by mouth every morning.  12/28/11   [provider]  ondansetron (ZOFRAN) 4 MG tablet Take 1 tablet (4 mg total)  by mouth every 8 (eight) hours as needed for nausea or vomiting. 10/03/19   Nafisa Olds, Darrelyn Hillock, FNP  ondansetron (ZOFRAN-ODT) 4 MG disintegrating tablet Take 4 mg by mouth every 12 (twelve) hours as needed.  03/03/12   [provider]  ranitidine (ZANTAC) 150 MG tablet TAKE ONE TABLET BY MOUTH EVERY NIGHT AT BEDTIME 12/17/16   [provider]  traZODone (DESYREL) 50 MG tablet Take 50 mg by mouth as needed.     [provider]  vitamin C (ASCORBIC ACID) 500 MG tablet Take 1,200 mg by mouth daily.     [provider]  Vitamin D, Ergocalciferol, (DRISDOL) 50000 units CAPS capsule daily.  04/29/17   [provider]    Family History Family History  Problem Relation Age of Onset  . Cancer Father        Prostate,Lung  . COPD Father   . Leukemia Sister   .  Cancer Brother        Prostate  . Heart attack Brother   . Cancer Brother        Prostate  . Heart attack Brother     Social History Social History   Tobacco Use  . Smoking status: Never Smoker  . Smokeless tobacco: Never Used  Substance Use Topics  . Alcohol use: Yes    Alcohol/week: 3.0 standard drinks    Types: 3 Glasses of wine per week    Comment: social  . Drug use: No     Allergies   Amoxicillin-pot clavulanate, Versed [midazolam], Biaxin [clarithromycin], Demerol [meperidine], Oxycodone, Percocet [oxycodone-acetaminophen], Sulfamethoxazole-trimethoprim, and Tetracyclines & related   Review of Systems Review of Systems  Constitutional: Negative.   HENT: Positive for sore throat.   Respiratory: Positive for cough.   Cardiovascular: Negative.   Gastrointestinal: Positive for nausea and vomiting.  Neurological: Positive for headaches.  ROS: all other are negatives   Physical Exam Triage Vital Signs ED Triage Vitals  Enc Vitals Group     BP 10/03/19 1133 115/72     Pulse Rate 10/03/19 1133 62     Resp 10/03/19 1133 16     Temp 10/03/19 1133 98.5 F (36.9 C)     Temp  Source 10/03/19 1133 Oral     SpO2 10/03/19 1133 96 %     Weight --      Height --      Head Circumference --      Peak Flow --      Pain Score 10/03/19 1148 0     Pain Loc --      Pain Edu? --      Excl. in [place]? --    No data found.  Updated Vital Signs BP 115/72 (BP Location: Right Arm)   Pulse 62   Temp 98.5 F (36.9 C) (Oral)   Resp 16   SpO2 96%   Visual Acuity Right Eye Distance:   Left Eye Distance:   Bilateral Distance:    Right Eye Near:   Left Eye Near:    Bilateral Near:     Physical Exam Vitals and nursing note reviewed.  Constitutional:      General: She is not in acute distress.    Appearance: Normal appearance. She is normal weight. She is not ill-appearing or toxic-appearing.  HENT:     Head: Normocephalic and atraumatic.     Right Ear: Tympanic membrane, ear canal and external ear normal. There is no impacted cerumen.     Left Ear: Tympanic membrane, ear canal and external ear normal. There is no impacted cerumen.     Nose: Nose normal. No congestion.     Mouth/Throat:     Mouth: Mucous membranes are moist.     Pharynx: No oropharyngeal exudate or posterior oropharyngeal erythema.  Cardiovascular:     Rate and Rhythm: Normal rate and regular rhythm.     Pulses: Normal pulses.     Heart sounds: Normal heart sounds. No murmur.  Pulmonary:     Effort: Pulmonary effort is normal. No respiratory distress.     Breath sounds: No wheezing or rhonchi.  Chest:     Chest wall: No tenderness.  Abdominal:     General: Abdomen is flat. Bowel sounds are normal. There is no distension.     Palpations: There is no mass.  Skin:    Capillary Refill: Capillary refill takes less than 2 seconds.  Neurological:  Mental Status: She is alert and oriented to person, place, and time.      UC Treatments / Results  Labs (all labs ordered are listed, but only abnormal results are displayed) Labs Reviewed  NOVEL CORONAVIRUS, NAA    EKG   Radiology No  results found.  Procedures Procedures (including critical care time)  Medications Ordered in UC Medications - No data to display  Initial Impression / Assessment and Plan / UC Course  I have reviewed the triage vital signs and the nursing notes.  Pertinent labs & imaging results that were available during my care of the patient were reviewed by me and considered in my medical decision making (see chart for details).    COVID-19 test was ordered.  Patient stable for discharge.  Advised patient to quarantine until COVID-19 test result become available.  To go to ED for worsening of symptoms.  Patient verbalized understanding of the plan of care.  Final Clinical Impressions(s) / UC Diagnoses   Final diagnoses:  Suspected COVID-19 virus infection     Discharge Instructions     COVID testing ordered.  It will take between 2-7 days for test results.  Someone will contact you regarding abnormal results.    In the meantime: You should remain isolated in your home for 10 days from symptom onset  Get plenty of rest and push fluids Tessalon Perles prescribed for cough Flonase prescribed for nasal congestion and runny nose Use medications daily for symptom relief Use OTC medications like ibuprofen or tylenol as needed fever or pain Call or go to the ED if you have any new or worsening symptoms such as fever, worsening cough, shortness of breath, chest tightness, chest pain, turning blue, changes in mental status, etc...        ED Prescriptions    Medication Sig Dispense Auth. Provider   benzonatate (TESSALON) 100 MG capsule Take 1 capsule (100 mg total) by mouth every 8 (eight) hours. 21 capsule Sallye Lunz S, FNP   fluticasone (FLONASE) 50 MCG/ACT nasal spray Place 1 spray into both nostrils daily for 14 days. 16 g Tamyah Cutbirth, Darrelyn Hillock, FNP   albuterol (VENTOLIN HFA) 108 (90 Base) MCG/ACT inhaler Inhale 1-2 puffs into the lungs every 6 (six) hours as needed for wheezing or shortness  of breath. 18 g Abbygail Willhoite S, FNP   ondansetron (ZOFRAN) 4 MG tablet Take 1 tablet (4 mg total) by mouth every 8 (eight) hours as needed for nausea or vomiting. 10 tablet Seriah Brotzman, Darrelyn Hillock, FNP     PDMP not reviewed this encounter.   Emerson Monte, FNP 10/03/19 1225

## 2019-10-03 NOTE — Discharge Instructions (Addendum)
COVID testing ordered.  It will take between 2-7 days for test results.  Someone will contact you regarding abnormal results.    In the meantime: You should remain isolated in your home for 10 days from symptom onset  Get plenty of rest and push fluids Tessalon Perles prescribed for cough Flonase prescribed for nasal congestion and runny nose Use medications daily for symptom relief Use OTC medications like ibuprofen or tylenol as needed fever or pain Call or go to the ED if you have any new or worsening symptoms such as fever, worsening cough, shortness of breath, chest tightness, chest pain, turning blue, changes in mental status, etc..Marland Kitchen

## 2019-10-04 LAB — NOVEL CORONAVIRUS, NAA: SARS-CoV-2, NAA: NOT DETECTED

## 2019-10-06 ENCOUNTER — Telehealth: Payer: Self-pay | Admitting: *Deleted

## 2019-10-06 NOTE — Telephone Encounter (Signed)
DOS 10/07/2019 HAMMER TOE REPAIR 4TH RIGHT FOOT - [id number]  [employer]: Eligibility Date - 10/02/2019  Plan Deductible Per Calendar Year $0.00 of $200.00 Met   Remaining: $200.00   Out-of-Pocket Maximum Per Calendar Year $0.00 of $2,200.00 Met  Remaining: $2,200.00  copay $0 / Day OUTPATIENT SURGERY   Co-Insurance 0% / Day OUTPATIENT SURGERY     Procedures performed at an [place] do not require prior authorization.

## 2019-10-07 ENCOUNTER — Encounter: Payer: Self-pay | Admitting: Podiatry

## 2019-10-07 ENCOUNTER — Other Ambulatory Visit: Payer: Self-pay

## 2019-10-07 ENCOUNTER — Ambulatory Visit (INDEPENDENT_AMBULATORY_CARE_PROVIDER_SITE_OTHER): Payer: Medicare Other | Admitting: Podiatry

## 2019-10-07 DIAGNOSIS — M2041 Other hammer toe(s) (acquired), right foot: Secondary | ICD-10-CM | POA: Diagnosis not present

## 2019-10-07 MED ORDER — HYDROCODONE-ACETAMINOPHEN 5-325 MG PO TABS: 1.0000 | ORAL_TABLET | Freq: Four times a day (QID) | ORAL | 0 refills | Status: DC | PRN

## 2019-10-13 NOTE — Progress Notes (Signed)
   OPERATIVE REPORT Patient name: Tamara Williams MRN: [id number] DOB: 1951/07/17  DOS:  10/13/19  Preop Dx: Garner Nash fourth digit right foot Postop Dx: same  Procedure:  1.  Hammertoe arthroplasty fourth digit right foot  Surgeon: Edrick Kins DPM  Anesthesia: 50-50 mixture of 2% lidocaine plain with 0.5% Marcaine plain totaling 6 mL infiltrated in the patient's right lower extremity  Hemostasis: Ankle tourniquet inflated to a pressure of 260mmHg after esmarch exsanguination   EBL: Minimal mL Materials: None Injectables: None Pathology: None  Condition: The patient tolerated the procedure and anesthesia well. No complications noted or reported   Justification for procedure: The patient is a 69 y.o. female who presents today for surgical correction of a symptomatic hammertoe to the fourth digit right foot with an overlying corn/callus. All conservative modalities of been unsuccessful in providing any sort of satisfactory alleviation of symptoms with the patient. The patient was told benefits as well as possible side effects of the surgery. The patient consented for surgical correction. The patient consent form was reviewed. All patient questions were answered. No guarantees were expressed or implied. The patient and the surgeon boson the patient consent form with the witness present and placed in the patient's chart.   Procedure in Detail: The patient was brought to the procedure room, placed in the procedure chair in the supine position at which time an aseptic scrub and drape were performed about the patient's respective lower extremity after anesthesia was induced as described above. Attention was then directed to the surgical area where procedure number one commenced.  Procedure #1: PIPJ arthroplasty fourth digit right foot A transverse elliptical incision was planned and made overlying the PIPJ of the fourth digit right foot.  Incision was carried down to the level of bone with  care taken to cut clamp ligate and retract away all small neurovascular structures traversing the incision site.  The ellipse wedge of skin was removed and the head of the proximal phalanx was exposed.  A bone cutter was utilized to resect away the head of the proximal phalanx at the surgical neck of the bone.  Irrigation was utilized and primary closure obtained using 4-0 nylon suture.  Dry sterile compressive dressings were then applied to all previously mentioned incision sites about the patient's lower extremity. The tourniquet which was used for hemostasis was deflated. All normal neurovascular responses including pink color and warmth returned all the digits of patient's lower extremity.  The patient was then discharged from the office with prescriptions for adequate analgesia.  Verbal as well as written instructions were provided for the patient regarding wound care. The patient is to keep the dressings clean dry and intact until they are to follow surgeon Dr. Daylene Katayama in the office upon discharge.   Edrick Kins, DPM [facility]  Dr. Edrick Kins, [place]                                        [place], [place] [address]                Office [phone]  Fax [phone]

## 2019-10-16 ENCOUNTER — Ambulatory Visit (INDEPENDENT_AMBULATORY_CARE_PROVIDER_SITE_OTHER): Payer: Medicare Other

## 2019-10-16 ENCOUNTER — Encounter: Payer: Self-pay | Admitting: Podiatry

## 2019-10-16 ENCOUNTER — Ambulatory Visit (INDEPENDENT_AMBULATORY_CARE_PROVIDER_SITE_OTHER): Payer: Medicare Other | Admitting: Podiatry

## 2019-10-16 ENCOUNTER — Other Ambulatory Visit: Payer: Self-pay

## 2019-10-16 VITALS — BP 108/58 | HR 62 | Temp 97.3°F

## 2019-10-16 DIAGNOSIS — Z9889 Other specified postprocedural states: Secondary | ICD-10-CM

## 2019-10-16 DIAGNOSIS — M2041 Other hammer toe(s) (acquired), right foot: Secondary | ICD-10-CM

## 2019-10-18 NOTE — Progress Notes (Signed)
   Subjective:  Patient presents today status post hammertoe repair 4th digit right. DOS: 10/07/2019. She states she is doing well and improving. She reports some mild intermittent pain that is alleviated by taking Tylenol. She denies aggravating factors. She has been using the post op shoe as directed. Patient is here for further evaluation and treatment.    Past Medical History:  Diagnosis Date  . Abnormal EKG 11/19/2018  . Anemia   . Anxiety   . Anxiety and depression 11/19/2018  . Arthritis   . Asthma   . Chronic sciatica   . Constipation   . Degenerative disc disease   . Depression   . GERD (gastroesophageal reflux disease)   . Heart murmur   . Hypercholesterolemia 11/19/2018  . Hypothyroidism   . Irritable bowel syndrome (IBS)   . Nausea and vomiting   . Sciatica 11/19/2018  . Thyroid disease   . Vertigo    with sinus issues.  none since 01/17      Objective/Physical Exam Neurovascular status intact.  Skin incisions appear to be well coapted with sutures and staples intact. No sign of infectious process noted. No dehiscence. No active bleeding noted. Moderate edema noted to the surgical extremity.  Radiographic Exam:  Orthopedic hardware and osteotomies sites appear to be stable with routine healing.  Assessment: 1. s/p hammertoe repair 4th digit right. DOS: 10/07/2019   Plan of Care:  1. Patient was evaluated. X-rays reviewed 2. Dressing changed.  3. Continue using post op shoe.  4. Return to clinic in one week for suture removal.    Edrick Kins, DPM [facility]  Dr. Edrick Kins, [address]                                        [place], [place] [address]                Office [phone]  Fax [phone]

## 2019-10-23 ENCOUNTER — Ambulatory Visit (INDEPENDENT_AMBULATORY_CARE_PROVIDER_SITE_OTHER): Payer: Medicare Other | Admitting: Podiatry

## 2019-10-23 ENCOUNTER — Other Ambulatory Visit: Payer: Self-pay

## 2019-10-23 DIAGNOSIS — M2041 Other hammer toe(s) (acquired), right foot: Secondary | ICD-10-CM

## 2019-10-23 DIAGNOSIS — Z9889 Other specified postprocedural states: Secondary | ICD-10-CM

## 2019-10-25 NOTE — Progress Notes (Signed)
   Subjective:  Patient presents today status post hammertoe repair 4th digit right. DOS: 10/07/2019. She states she is doing well. She reports some minor redness and swelling. There are no modifying factors noted. She has been using the post op shoe as directed. Patient is here for further evaluation and treatment.   Past Medical History:  Diagnosis Date  . Abnormal EKG 11/19/2018  . Anemia   . Anxiety   . Anxiety and depression 11/19/2018  . Arthritis   . Asthma   . Chronic sciatica   . Constipation   . Degenerative disc disease   . Depression   . GERD (gastroesophageal reflux disease)   . Heart murmur   . Hypercholesterolemia 11/19/2018  . Hypothyroidism   . Irritable bowel syndrome (IBS)   . Nausea and vomiting   . Sciatica 11/19/2018  . Thyroid disease   . Vertigo    with sinus issues.  none since 01/17      Objective/Physical Exam Neurovascular status intact.  Skin incisions appear to be well coapted with sutures and staples intact. No sign of infectious process noted. No dehiscence. No active bleeding noted. Moderate edema noted to the surgical extremity.  Assessment: 1. s/p hammertoe repair 4th digit right. DOS: 10/07/2019   Plan of Care:  1. Patient was evaluated.  2. Sutures removed.  3. Transition out of post op shoe.  4. Return to clinic in 4 weeks to address tenotomy of elevated right third toe.    Edrick Kins, DPM [facility]  Dr. Edrick Kins, [address]                                        [place], [place] [address]                Office [phone]  Fax [phone]

## 2019-11-03 ENCOUNTER — Ambulatory Visit (INDEPENDENT_AMBULATORY_CARE_PROVIDER_SITE_OTHER): Payer: Medicare Other

## 2019-11-03 ENCOUNTER — Ambulatory Visit (INDEPENDENT_AMBULATORY_CARE_PROVIDER_SITE_OTHER): Payer: Medicare Other | Admitting: Podiatry

## 2019-11-03 ENCOUNTER — Other Ambulatory Visit: Payer: Self-pay

## 2019-11-03 ENCOUNTER — Ambulatory Visit: Payer: Medicare Other | Admitting: Podiatry

## 2019-11-03 ENCOUNTER — Encounter: Payer: Self-pay | Admitting: Podiatry

## 2019-11-03 DIAGNOSIS — Z9889 Other specified postprocedural states: Secondary | ICD-10-CM

## 2019-11-03 DIAGNOSIS — M2041 Other hammer toe(s) (acquired), right foot: Secondary | ICD-10-CM

## 2019-11-03 DIAGNOSIS — M21271 Flexion deformity, right ankle and toes: Secondary | ICD-10-CM | POA: Diagnosis not present

## 2019-11-03 MED ORDER — MELOXICAM 15 MG PO TABS: 15.0000 mg | ORAL_TABLET | Freq: Every day | ORAL | 1 refills | Status: DC

## 2019-11-03 NOTE — Progress Notes (Signed)
Subjective:  Patient presents today status post hammertoe repair 4th digit right. DOS: 10/07/2019. She states she is doing well.  Patient continues to have some soreness to the toe.  She has been wearing the postsurgical shoe as directed.  Patient also complains that the third toe to the right foot that has had surgery in the past is elevated.  It has been elevated since surgery and it sticks out above all the other digits of the foot.  This toe is very painful because it rubs in shoes and can be very symptomatic.  Past Medical History:  Diagnosis Date  . Abnormal EKG 11/19/2018  . Anemia   . Anxiety   . Anxiety and depression 11/19/2018  . Arthritis   . Asthma   . Chronic sciatica   . Constipation   . Degenerative disc disease   . Depression   . GERD (gastroesophageal reflux disease)   . Heart murmur   . Hypercholesterolemia 11/19/2018  . Hypothyroidism   . Irritable bowel syndrome (IBS)   . Nausea and vomiting   . Sciatica 11/19/2018  . Thyroid disease   . Vertigo    with sinus issues.  none since 01/17      Objective/Physical Exam Neurovascular status intact.  Skin incisions appear to be well coapted. No sign of infectious process noted.  There is some erythema localized around the surgical area of the right fourth toe.  There is also dorsiflexion noted to the right third toe.  The toe is in a rectus position and is dorsiflexed at the level of the MTPJ.  There is extensor substitution with tight extensor tendons noted.  There is no range of motion of the PIPJ or DIPJ of the digit secondary to prior surgery.  Assessment: 1. s/p hammertoe repair 4th digit right. DOS: 10/07/2019 2.  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 Patient was evaluated.  2.  In regards to the hammertoe surgery site of the fourth toe right foot, continue wearing the postoperative shoe until the swelling decreases. 3.  Prescription for meloxicam  15 mg daily 4.  Today I did offer the patient an extensor tenotomy to release the pressure from the third toe that is elevated and drop it down to normal alignment with the remaining digits.  The patient agreed.  All possible complications and details the procedure were explained.  No guarantees were expressed or implied. 5.  2 cc of 2% lidocaine plain was infiltrated along the dorsal aspect of the foot.  At that time the foot was prepped in an aseptic manner using Betadine and a surgical #11 blade was utilized to create a small stab incision and tenotomy of the EDL tendon to the third digit.  Once the tenotomy was performed immediately the third digit was in a more plantarflexed 3 position and in better alignment with the remaining toes. 6.  Dry sterile dressings were applied.   7.  Return to clinic in 4 weeks  Edrick Kins, DPM [facility]  Dr. Edrick Kins, [address]                                        [place], [place] [address]  Office [phone]  Fax [phone]

## 2019-11-06 ENCOUNTER — Encounter: Payer: Medicare Other | Admitting: Podiatry

## 2019-11-12 ENCOUNTER — Ambulatory Visit: Payer: Medicare Other | Attending: Internal Medicine

## 2019-11-12 ENCOUNTER — Ambulatory Visit: Payer: Medicare Other

## 2019-11-12 DIAGNOSIS — Z23 Encounter for immunization: Secondary | ICD-10-CM | POA: Insufficient documentation

## 2019-11-12 NOTE — Progress Notes (Signed)
   Covid-19 Vaccination Clinic  Name:  Tamara Williams    MRN: [id number] DOB: 1951/07/04  11/12/2019  [name] was observed post Covid-19 immunization for 15 minutes without incidence. She was provided with Vaccine Information Sheet and instruction to access the V-Safe system.   [name] was instructed to call 911 with any severe reactions post vaccine: Marland Kitchen Difficulty breathing  . Swelling of your face and throat  . A fast heartbeat  . A bad rash all over your body  . Dizziness and weakness    Immunizations Administered    Name Date Dose VIS Date Route   Pfizer COVID-19 Vaccine 11/12/2019  8:47 AM 0.3 mL 09/11/2019 Intramuscular   Manufacturer: [place]   Lot: [id number]   [place]: [id number]

## 2019-11-20 ENCOUNTER — Encounter: Payer: Medicare Other | Admitting: Podiatry

## 2019-12-01 ENCOUNTER — Ambulatory Visit (INDEPENDENT_AMBULATORY_CARE_PROVIDER_SITE_OTHER): Payer: Medicare Other

## 2019-12-01 ENCOUNTER — Other Ambulatory Visit: Payer: Self-pay

## 2019-12-01 ENCOUNTER — Ambulatory Visit (INDEPENDENT_AMBULATORY_CARE_PROVIDER_SITE_OTHER): Payer: Medicare Other | Admitting: Podiatry

## 2019-12-01 ENCOUNTER — Encounter: Payer: Self-pay | Admitting: Podiatry

## 2019-12-01 DIAGNOSIS — M2041 Other hammer toe(s) (acquired), right foot: Secondary | ICD-10-CM

## 2019-12-01 DIAGNOSIS — Z9889 Other specified postprocedural states: Secondary | ICD-10-CM

## 2019-12-04 NOTE — Progress Notes (Signed)
   Subjective:  Patient presents today status post extensor tenotomy right 3rd digit and hammertoe repair right 4th digit. DOS: 10/07/2019. She states she is doing well. She reports some sensitivity of the 4th digit caused by touching it. She denies any pain or difficulty with ambulation. She has been using the post op shoe as directed. Patient is here for further evaluation and treatment.   Past Medical History:  Diagnosis Date  . Abnormal EKG 11/19/2018  . Anemia   . Anxiety   . Anxiety and depression 11/19/2018  . Arthritis   . Asthma   . Chronic sciatica   . Constipation   . Degenerative disc disease   . Depression   . GERD (gastroesophageal reflux disease)   . Heart murmur   . Hypercholesterolemia 11/19/2018  . Hypothyroidism   . Irritable bowel syndrome (IBS)   . Nausea and vomiting   . Sciatica 11/19/2018  . Thyroid disease   . Vertigo    with sinus issues.  none since 01/17      Objective/Physical Exam Neurovascular status intact.  Skin incisions appear to be well coapted. No sign of infectious process noted. No dehiscence. No active bleeding noted. Moderate edema noted to the surgical extremity.  Radiographic Exam:  Osteotomies sites appear to be stable with routine healing.  Assessment: 1. s/p extensor tenotomy right 3rd digit and hammertoe repair right 4th digit. DOS: 10/07/2019   Plan of Care:  1. Patient was evaluated. X-rays reviewed 2. Recommended good shoe gear.  3. May resume full activity with no restrictions.  4. Return to clinic as needed.    Edrick Kins, DPM [facility]  Dr. Edrick Kins, [address]                                        [place], [place] [address]                Office [phone]  Fax [phone]

## 2019-12-09 ENCOUNTER — Ambulatory Visit: Payer: Medicare Other | Attending: Internal Medicine

## 2019-12-09 DIAGNOSIS — Z23 Encounter for immunization: Secondary | ICD-10-CM

## 2019-12-09 NOTE — Progress Notes (Signed)
   Covid-19 Vaccination Clinic  Name:  Tamara Williams    MRN: [id number] DOB: 22-May-1951  12/09/2019  Tamara Williams was observed post Covid-19 immunization for 15 minutes without incident. She was provided with Vaccine Information Sheet and instruction to access the V-Safe system.   Tamara Williams was instructed to call 911 with any severe reactions post vaccine: Marland Kitchen Difficulty breathing  . Swelling of face and throat  . A fast heartbeat  . A bad rash all over body  . Dizziness and weakness   Immunizations Administered    Name Date Dose VIS Date Route   Pfizer COVID-19 Vaccine 12/09/2019 12:44 PM 0.3 mL 09/11/2019 Intramuscular   Manufacturer: [place]   Lot: [id number]   [place]: [id number]

## 2020-03-08 ENCOUNTER — Ambulatory Visit: Payer: Medicare Other | Admitting: Podiatry

## 2020-03-08 ENCOUNTER — Encounter: Payer: Self-pay | Admitting: Podiatry

## 2020-03-08 ENCOUNTER — Ambulatory Visit (INDEPENDENT_AMBULATORY_CARE_PROVIDER_SITE_OTHER): Payer: Medicare Other | Admitting: Podiatry

## 2020-03-08 ENCOUNTER — Other Ambulatory Visit: Payer: Self-pay

## 2020-03-08 DIAGNOSIS — M2041 Other hammer toe(s) (acquired), right foot: Secondary | ICD-10-CM

## 2020-03-08 DIAGNOSIS — L989 Disorder of the skin and subcutaneous tissue, unspecified: Secondary | ICD-10-CM | POA: Diagnosis not present

## 2020-03-08 NOTE — Progress Notes (Signed)
   Subjective: 69 y.o. female presenting to the office today for evaluation of a symptomatic callus lesion to the plantar aspect of the first MPJ right foot.  Patient does have a history of hammertoe repair to the right third and fourth digits.  DOS: 10/07/2019.  She states that the hammertoes are doing very well.  No complaints or pain associated with the hammertoes.  The callus lesion has been present for the past few months and is very symptomatic even with shoe gear.  Slowly developed over time.   Past Medical History:  Diagnosis Date  . Abnormal EKG 11/19/2018  . Anemia   . Anxiety   . Anxiety and depression 11/19/2018  . Arthritis   . Asthma   . Chronic sciatica   . Constipation   . Degenerative disc disease   . Depression   . GERD (gastroesophageal reflux disease)   . Heart murmur   . Hypercholesterolemia 11/19/2018  . Hypothyroidism   . Irritable bowel syndrome (IBS)   . Nausea and vomiting   . Sciatica 11/19/2018  . Thyroid disease   . Vertigo    with sinus issues.  none since 01/17     Objective:  Physical Exam General: Alert and oriented x3 in no acute distress  Dermatology: Hyperkeratotic lesion(s) present on the plantar aspect of the first MPJ right foot. Pain on palpation with a central nucleated core noted. Skin is warm, dry and supple bilateral lower extremities. Negative for open lesions or macerations.  Vascular: Palpable pedal pulses bilaterally. No edema or erythema noted. Capillary refill within normal limits.  Neurological: Epicritic and protective threshold grossly intact bilaterally.   Musculoskeletal Exam: Pain on palpation at the keratotic lesion(s) noted. Range of motion within normal limits bilateral. Muscle strength 5/5 in all groups bilateral.  Assessment: 1.  Preulcerative callus lesion noted first MPJ plantar aspect right foot   Plan of Care:  1. Patient evaluated 2. Excisional debridement of keratoic lesion(s) using a chisel blade was  performed without incident.  3.  Recommend revitaderm OTC urea 40% lotion daily. 4. Patient is to return to the clinic PRN.   Edrick Kins, DPM [facility]  Dr. Edrick Kins, [address]                                        [place], [place] [address]                Office [phone]  Fax [phone]

## 2020-05-20 ENCOUNTER — Other Ambulatory Visit: Payer: Medicare Other

## 2020-05-20 ENCOUNTER — Other Ambulatory Visit: Payer: Self-pay

## 2020-05-20 DIAGNOSIS — Z20822 Contact with and (suspected) exposure to covid-19: Secondary | ICD-10-CM

## 2020-05-21 LAB — NOVEL CORONAVIRUS, NAA: SARS-CoV-2, NAA: NOT DETECTED

## 2020-05-21 LAB — SPECIMEN STATUS REPORT

## 2020-05-21 LAB — SARS-COV-2, NAA 2 DAY TAT

## 2020-06-14 ENCOUNTER — Other Ambulatory Visit: Payer: Self-pay

## 2020-06-14 ENCOUNTER — Ambulatory Visit (INDEPENDENT_AMBULATORY_CARE_PROVIDER_SITE_OTHER): Payer: Medicare Other | Admitting: Podiatry

## 2020-06-14 DIAGNOSIS — M722 Plantar fascial fibromatosis: Secondary | ICD-10-CM

## 2020-06-14 NOTE — Progress Notes (Signed)
   Subjective: 69 y.o. female established to the practice presenting today for new complaint regarding left heel pain.  Patient has noticed heel pain to the left foot for the past few weeks.  She went to the beach and that is when she noticed that she began to get some pain.  Aggravated by walking.  She has not done anything for treatment at the moment.  She presents for further treatment and evaluation   Past Medical History:  Diagnosis Date  . Abnormal EKG 11/19/2018  . Anemia   . Anxiety   . Anxiety and depression 11/19/2018  . Arthritis   . Asthma   . Chronic sciatica   . Constipation   . Degenerative disc disease   . Depression   . GERD (gastroesophageal reflux disease)   . Heart murmur   . Hypercholesterolemia 11/19/2018  . Hypothyroidism   . Irritable bowel syndrome (IBS)   . Nausea and vomiting   . Sciatica 11/19/2018  . Thyroid disease   . Vertigo    with sinus issues.  none since 01/17     Objective: Physical Exam General: The patient is alert and oriented x3 in no acute distress.  Dermatology: Skin is warm, dry and supple bilateral lower extremities. Negative for open lesions or macerations bilateral.   Vascular: Dorsalis Pedis and Posterior Tibial pulses palpable bilateral.  Capillary fill time is immediate to all digits.  Neurological: Epicritic and protective threshold intact bilateral.   Musculoskeletal: Tenderness to palpation to the plantar aspect of the left heel along the plantar fascia. All other joints range of motion within normal limits bilateral. Strength 5/5 in all groups bilateral.    Assessment: 1. Plantar fasciitis left foot  Plan of Care:  1. Patient evaluated. 2. Injection of 0.5cc Celestone soluspan injected into the left plantar fascia.  3.  Patient has a prescription for meloxicam already.  Take daily as needed pain 4. Instructed patient regarding therapies and modalities at home to alleviate symptoms.  5. Return to clinic in 4 weeks.      Edrick Kins, DPM [facility]  Dr. Edrick Kins, DPM    [address],  [address]                Office [phone]  Fax [phone]

## 2020-07-01 ENCOUNTER — Ambulatory Visit: Payer: Self-pay | Admitting: *Deleted

## 2020-07-01 NOTE — Telephone Encounter (Signed)
Summary: Clinical Advice   Patient would like to know if she should wait to get covid booster due to her having antibodies count being over 600. Please advise      Call to patient- left message on voice mail- do not have information pertaining to booster vs antibodies. She does fall into the category of eligible patients as long as she received her vaccine 6 months ago and she received the Pfiizer vaccine.  Advised call PCP for more direction.   Reason for Disposition . [1] Caller requesting NON-URGENT health information AND [2] PCP's office is the best resource  Protocols used: INFORMATION ONLY CALL - NO TRIAGE-A-AH

## 2020-07-05 ENCOUNTER — Ambulatory Visit: Payer: Medicare Other

## 2020-09-12 ENCOUNTER — Ambulatory Visit: Payer: Medicare Other | Attending: Internal Medicine

## 2020-09-12 DIAGNOSIS — Z23 Encounter for immunization: Secondary | ICD-10-CM

## 2020-09-12 NOTE — Progress Notes (Signed)
   Covid-19 Vaccination Clinic  Name:  Tamara Williams    MRN: [id number] DOB: July 31, 1951  09/12/2020  [name] was observed post Covid-19 immunization for 15 minutes without incident. She was provided with Vaccine Information Sheet and instruction to access the V-Safe system.   [name] was instructed to call 911 with any severe reactions post vaccine: Marland Kitchen Difficulty breathing  . Swelling of face and throat  . A fast heartbeat  . A bad rash all over body  . Dizziness and weakness   Immunizations Administered    Name Date Dose VIS Date Route   Pfizer COVID-19 Vaccine 09/12/2020  1:19 PM 0.3 mL 07/20/2020 Intramuscular   Manufacturer: [place]   Lot: [id number]   NDC: [address]-3

## 2020-10-03 ENCOUNTER — Ambulatory Visit: Payer: Medicare Other

## 2020-10-07 ENCOUNTER — Ambulatory Visit (HOSPITAL_COMMUNITY)
Admission: RE | Admit: 2020-10-07 | Discharge: 2020-10-07 | Disposition: A | Discharge status: Home / Self Care | Payer: Medicare Other | Source: Ambulatory Visit | Attending: Internal Medicine | Admitting: Internal Medicine

## 2020-10-07 ENCOUNTER — Other Ambulatory Visit: Payer: Self-pay

## 2020-10-07 ENCOUNTER — Other Ambulatory Visit (HOSPITAL_COMMUNITY): Payer: Self-pay | Admitting: Internal Medicine

## 2020-10-07 DIAGNOSIS — R062 Wheezing: Secondary | ICD-10-CM | POA: Diagnosis not present

## 2020-11-15 ENCOUNTER — Other Ambulatory Visit: Payer: Self-pay

## 2020-11-15 ENCOUNTER — Ambulatory Visit (INDEPENDENT_AMBULATORY_CARE_PROVIDER_SITE_OTHER): Payer: Medicare Other | Admitting: Podiatry

## 2020-11-15 DIAGNOSIS — M722 Plantar fascial fibromatosis: Secondary | ICD-10-CM | POA: Diagnosis not present

## 2020-11-15 MED ORDER — BETAMETHASONE SOD PHOS & ACET 6 (3-3) MG/ML IJ SUSP
3.0000 mg | Freq: Once | INTRAMUSCULAR | Status: AC
  Administered 2020-11-15: 3 mg via INTRA_ARTICULAR

## 2020-11-15 NOTE — Patient Instructions (Signed)
Go to [facility] on [address] in [place]

## 2020-11-15 NOTE — Progress Notes (Signed)
   Subjective: 70 y.o. female established to the practice presenting today for follow-up evaluation of left heel pain has been going on for several months now.  The injection she received last visit helped significantly.  She has been taking meloxicam as needed.  She presents for further treatment and evaluation  Past Medical History:  Diagnosis Date  . Abnormal EKG 11/19/2018  . Anemia   . Anxiety   . Anxiety and depression 11/19/2018  . Arthritis   . Asthma   . Chronic sciatica   . Constipation   . Degenerative disc disease   . Depression   . GERD (gastroesophageal reflux disease)   . Heart murmur   . Hypercholesterolemia 11/19/2018  . Hypothyroidism   . Irritable bowel syndrome (IBS)   . Nausea and vomiting   . Sciatica 11/19/2018  . Thyroid disease   . Vertigo    with sinus issues.  none since 01/17     Objective: Physical Exam General: The patient is alert and oriented x3 in no acute distress.  Dermatology: Skin is warm, dry and supple bilateral lower extremities. Negative for open lesions or macerations bilateral.   Vascular: Dorsalis Pedis and Posterior Tibial pulses palpable bilateral.  Capillary fill time is immediate to all digits.  Neurological: Epicritic and protective threshold intact bilateral.   Musculoskeletal: Tenderness to palpation to the plantar aspect of the left heel along the plantar fascia. All other joints range of motion within normal limits bilateral. Strength 5/5 in all groups bilateral.    Assessment: 1. Plantar fasciitis left foot  Plan of Care:  1. Patient evaluated. 2. Injection of 0.5cc Celestone soluspan injected into the left plantar fascia.  3.  Patient has a prescription for meloxicam already.  Take daily as needed pain 4.  Recommend shoes and supportive insoles from [employer] in [place] 5. Return to clinic in 4 weeks.    *goes by Erich Montane, DPM [facility]  Dr. Edrick Kins, DPM     [address], [place] [address]                Office [phone]  Fax [phone]

## 2020-12-13 ENCOUNTER — Ambulatory Visit (INDEPENDENT_AMBULATORY_CARE_PROVIDER_SITE_OTHER): Payer: Medicare Other | Admitting: Podiatry

## 2020-12-13 ENCOUNTER — Other Ambulatory Visit: Payer: Self-pay

## 2020-12-13 DIAGNOSIS — M722 Plantar fascial fibromatosis: Secondary | ICD-10-CM

## 2020-12-13 MED ORDER — BETAMETHASONE SOD PHOS & ACET 6 (3-3) MG/ML IJ SUSP
3.0000 mg | Freq: Once | INTRAMUSCULAR | Status: AC
  Administered 2020-12-13: 3 mg via INTRA_ARTICULAR

## 2020-12-13 MED ORDER — METHYLPREDNISOLONE 4 MG PO TBPK: ORAL_TABLET | ORAL | 0 refills | Status: DC

## 2020-12-13 NOTE — Progress Notes (Signed)
   Subjective: 70 y.o. female established to the practice presenting today for follow-up evaluation of left heel pain has been going on for several months now.  Patient states that the injections helped however she continues to have some pain and tenderness to the left heel.  No new complaints at this time  Past Medical History:  Diagnosis Date  . Abnormal EKG 11/19/2018  . Anemia   . Anxiety   . Anxiety and depression 11/19/2018  . Arthritis   . Asthma   . Chronic sciatica   . Constipation   . Degenerative disc disease   . Depression   . GERD (gastroesophageal reflux disease)   . Heart murmur   . Hypercholesterolemia 11/19/2018  . Hypothyroidism   . Irritable bowel syndrome (IBS)   . Nausea and vomiting   . Sciatica 11/19/2018  . Thyroid disease   . Vertigo    with sinus issues.  none since 01/17     Objective: Physical Exam General: The patient is alert and oriented x3 in no acute distress.  Dermatology: Skin is warm, dry and supple bilateral lower extremities. Negative for open lesions or macerations bilateral.   Vascular: Dorsalis Pedis and Posterior Tibial pulses palpable bilateral.  Capillary fill time is immediate to all digits.  Neurological: Epicritic and protective threshold intact bilateral.   Musculoskeletal: Tenderness to palpation to the plantar aspect of the left heel along the plantar fascia. All other joints range of motion within normal limits bilateral. Strength 5/5 in all groups bilateral.    Assessment: 1. Plantar fasciitis left foot  Plan of Care:  1. Patient evaluated. 2. Injection of 0.5cc Celestone soluspan injected into the left plantar fascia.  3.  Patient states that recently the meloxicam has caused GI upset.  She is used prednisone packs in the past which have helped significantly.  Prescription for prednisone pack  4.  Continue new balance shoes that she purchased recently.  OTC power step insoles provided 5. Return to clinic in 4 weeks.     *goes by Erich Montane, DPM [facility]  Dr. Edrick Kins, DPM    [address], [place] [address]                Office [phone]  Fax [phone]

## 2021-01-10 ENCOUNTER — Ambulatory Visit: Payer: Medicare Other | Admitting: Podiatry

## 2021-01-20 ENCOUNTER — Encounter: Payer: Self-pay | Admitting: Podiatry

## 2021-01-20 ENCOUNTER — Ambulatory Visit (INDEPENDENT_AMBULATORY_CARE_PROVIDER_SITE_OTHER): Payer: Medicare Other | Admitting: Podiatry

## 2021-01-20 ENCOUNTER — Other Ambulatory Visit: Payer: Self-pay

## 2021-01-20 DIAGNOSIS — M722 Plantar fascial fibromatosis: Secondary | ICD-10-CM

## 2021-01-20 MED ORDER — BETAMETHASONE SOD PHOS & ACET 6 (3-3) MG/ML IJ SUSP
3.0000 mg | Freq: Once | INTRAMUSCULAR | Status: AC
  Administered 2021-01-20: 3 mg via INTRA_ARTICULAR

## 2021-01-20 NOTE — Progress Notes (Signed)
   Subjective: 70 y.o. female established to the practice presenting today for follow-up evaluation of left heel pain has been going on for several months now.  Patient states that the injection helped temporarily.  She presents for further treatment and evaluation  Past Medical History:  Diagnosis Date  . Abnormal EKG 11/19/2018  . Anemia   . Anxiety   . Anxiety and depression 11/19/2018  . Arthritis   . Asthma   . Chronic sciatica   . Constipation   . Degenerative disc disease   . Depression   . GERD (gastroesophageal reflux disease)   . Heart murmur   . Hypercholesterolemia 11/19/2018  . Hypothyroidism   . Irritable bowel syndrome (IBS)   . Nausea and vomiting   . Sciatica 11/19/2018  . Thyroid disease   . Vertigo    with sinus issues.  none since 01/17     Objective: Physical Exam General: The patient is alert and oriented x3 in no acute distress.  Dermatology: Skin is warm, dry and supple bilateral lower extremities. Negative for open lesions or macerations bilateral.   Vascular: Dorsalis Pedis and Posterior Tibial pulses palpable bilateral.  Capillary fill time is immediate to all digits.  Neurological: Epicritic and protective threshold intact bilateral.   Musculoskeletal: Tenderness to palpation to the plantar aspect of the left heel along the plantar fascia. All other joints range of motion within normal limits bilateral. Strength 5/5 in all groups bilateral.    Assessment: 1. Plantar fasciitis left foot  Plan of Care:  1. Patient evaluated. 2. Injection of 0.5cc Celestone soluspan injected into the left plantar fascia.  3.  Continue meloxicam or Celebrex as needed.  She does have history of GI upset, however she has both prescriptions at home 4.  Continue new balance shoes that she purchased recently.  OTC power step insoles provided.  Last visit we gave her size 8-8.5 however she states that they were too large.  Today we dispensed 7-7.5 which seem to fit  better in her shoes 5. Return to clinic in 4 weeks.    *goes by [employer].  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 Edrick Kins, DPM    [address], [place] [address]                Office [phone]  Fax [phone]

## 2021-02-24 ENCOUNTER — Encounter: Payer: Self-pay | Admitting: Podiatry

## 2021-02-24 ENCOUNTER — Ambulatory Visit (INDEPENDENT_AMBULATORY_CARE_PROVIDER_SITE_OTHER): Payer: Medicare Other | Admitting: Podiatry

## 2021-02-24 ENCOUNTER — Other Ambulatory Visit: Payer: Self-pay

## 2021-02-24 DIAGNOSIS — M722 Plantar fascial fibromatosis: Secondary | ICD-10-CM | POA: Diagnosis not present

## 2021-02-24 MED ORDER — BETAMETHASONE SOD PHOS & ACET 6 (3-3) MG/ML IJ SUSP
3.0000 mg | Freq: Once | INTRAMUSCULAR | Status: AC
  Administered 2021-02-24: 3 mg via INTRA_ARTICULAR

## 2021-02-24 NOTE — Progress Notes (Signed)
   Subjective: 70 y.o. female established to the practice presenting today for follow-up evaluation of plantar fasciitis to the left heel has been going on for several months now.  Patient states the injection helped significantly.  She is currently taking Celebrex and she was prescribed a prednisone pack from her PCP for unrelated issues.  She presents for follow-up treatment evaluation.  Overall she states that she is doing much better.  She also wears the OTC power step insoles with new balance shoes  Past Medical History:  Diagnosis Date  . Abnormal EKG 11/19/2018  . Anemia   . Anxiety   . Anxiety and depression 11/19/2018  . Arthritis   . Asthma   . Chronic sciatica   . Constipation   . Degenerative disc disease   . Depression   . GERD (gastroesophageal reflux disease)   . Heart murmur   . Hypercholesterolemia 11/19/2018  . Hypothyroidism   . Irritable bowel syndrome (IBS)   . Nausea and vomiting   . Sciatica 11/19/2018  . Thyroid disease   . Vertigo    with sinus issues.  none since 01/17     Objective: Physical Exam General: The patient is alert and oriented x3 in no acute distress.  Dermatology: Skin is warm, dry and supple bilateral lower extremities. Negative for open lesions or macerations bilateral.   Vascular: Dorsalis Pedis and Posterior Tibial pulses palpable bilateral.  Capillary fill time is immediate to all digits.  Neurological: Epicritic and protective threshold intact bilateral.   Musculoskeletal: Tenderness to palpation to the plantar aspect of the left heel along the plantar fascia. All other joints range of motion within normal limits bilateral. Strength 5/5 in all groups bilateral.    Assessment: 1. Plantar fasciitis left foot  Plan of Care:  1. Patient evaluated. 2. Injection of 0.5cc Celestone soluspan injected into the left plantar fascia.  3.  Continue Celebrex as prescribed 4.  Continue new balance shoes that she purchased recently with OTC  power step insoles  5. Return to clinic as needed  *goes by [employer].  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 Edrick Kins, DPM    [address], [place] [address]                Office [phone]  Fax [phone]

## 2021-03-21 ENCOUNTER — Ambulatory Visit: Payer: Medicare Other

## 2021-03-30 ENCOUNTER — Other Ambulatory Visit: Payer: Self-pay

## 2021-03-30 ENCOUNTER — Ambulatory Visit
Admission: RE | Admit: 2021-03-30 | Discharge: 2021-03-30 | Disposition: A | Discharge status: Home / Self Care | Payer: Medicare Other | Source: Ambulatory Visit | Attending: Emergency Medicine | Admitting: Emergency Medicine

## 2021-03-30 VITALS — BP 123/80 | HR 76 | Temp 98.5°F | Resp 18

## 2021-03-30 DIAGNOSIS — L03115 Cellulitis of right lower limb: Secondary | ICD-10-CM

## 2021-03-30 DIAGNOSIS — S80811A Abrasion, right lower leg, initial encounter: Secondary | ICD-10-CM

## 2021-03-30 MED ORDER — CEPHALEXIN 500 MG PO CAPS: 500.0000 mg | ORAL_CAPSULE | Freq: Four times a day (QID) | ORAL | 0 refills | Status: DC

## 2021-03-30 NOTE — ED Provider Notes (Signed)
Roderic Palau    CSN: [id number] Arrival date & time: 03/30/21  1962      History   Chief Complaint Chief Complaint  Patient presents with   Abrasion    HPI Tamara Williams is a 70 y.o. female.  Patient presents with multiple abrasions on her right lower leg after falling on 03/27/2021 in the yard while gardening.  The area around the abrasions has gotten red and tender; the redness has improved greatly today she states.  She denies fever, chills, drainage, or other symptoms.  Treatment at home with topical antibiotic ointment and salt water soaks.  Her medical history includes asthma, anemia, IBS, chronic sciatica, degenerative disc disease, vertigo, anxiety, depression.  The history is provided by the patient and medical records.   Past Medical History:  Diagnosis Date   Abnormal EKG 11/19/2018   Anemia    Anxiety    Anxiety and depression 11/19/2018   Arthritis    Asthma    Chronic sciatica    Constipation    Degenerative disc disease    Depression    GERD (gastroesophageal reflux disease)    Heart murmur    Hypercholesterolemia 11/19/2018   Hypothyroidism    Irritable bowel syndrome (IBS)    Nausea and vomiting    Sciatica 11/19/2018   Thyroid disease    Vertigo    with sinus issues.  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   GERD (gastroesophageal reflux disease)-recurrent 03/02/2013   Other iron deficiency anemia 10/05/2009   OTHER ACQUIRED DEFORMITY OF ANKLE AND FOOT OTHER 10/05/2009   Other acquired deformities of unspecified foot 10/05/2009    Other hammer toe(s) (acquired), unspecified foot 10/05/2009   METATARSALGIA 09/14/2009   FOOT PAIN, BILATERAL 09/14/2009   UNEQUAL LEG LENGTH 09/14/2009   ABNORMALITY OF GAIT 09/14/2009   Enthesopathy 09/14/2009    Past Surgical History:  Procedure Laterality Date   ABDOMINAL HYSTERECTOMY  09/15/1988   BREAST LUMPECTOMY Left 2002   CARPAL TUNNEL RELEASE Right    CHOLECYSTECTOMY     COLONOSCOPY WITH PROPOFOL N/A 11/11/2017   Procedure: COLONOSCOPY WITH PROPOFOL;  Surgeon: Lollie Sails, MD;  Location: [facility] ENDOSCOPY;  Service: Endoscopy;  Laterality: N/A;   COLONOSCOPY WITH PROPOFOL N/A 07/03/2018   Procedure: COLONOSCOPY WITH PROPOFOL;  Surgeon: Lollie Sails, MD;  Location: [facility] ENDOSCOPY;  Service: Endoscopy;  Laterality: N/A;   ESOPHAGOGASTRODUODENOSCOPY (EGD) WITH PROPOFOL N/A 11/11/2017   Procedure: ESOPHAGOGASTRODUODENOSCOPY (EGD) WITH PROPOFOL;  Surgeon: Lollie Sails, MD;  Location: [facility] ENDOSCOPY;  Service: Endoscopy;  Laterality: N/A;   FOOT SURGERY  01/03/2010   GASTROPLASTY  07/21/2010   Lap nissan   KNEE ARTHROSCOPY Right 2015   KNEE ARTHROSCOPY WITH LATERAL MENISECTOMY Right 03/08/2016   Procedure: KNEE ARTHROSCOPY WITH PARTIAL LATERAL MENISECTOMY;  Surgeon: Ninetta Lights, MD;  Location: [place];  Service: Orthopedics;  Laterality: Right;   KNEE ARTHROSCOPY WITH MEDIAL MENISECTOMY Right 03/08/2016   Procedure: RIGHT KNEE ARTHROSCOPY CHONDROPLASTY WITH MEDIAL MENISCECTOMY;  Surgeon: Ninetta Lights, MD;  Location: [place];  Service: Orthopedics;  Laterality: Right;   nissen fundoplication with gastroplasty     OVARY SURGERY  07/19/1988   Removal   SHOULDER SURGERY Left 1996   TONSILLECTOMY      OB History   No obstetric history on file.      Home Medications    Prior to Admission medications   Medication Sig Start Date End Date Taking? Authorizing Provider  cephALEXin (KEFLEX) 500 MG capsule Take 1 capsule (500  mg total) by mouth 4 (four) times daily. 03/30/21  Yes Sharion Balloon, NP  albuterol (VENTOLIN HFA) 108 (90 Base) MCG/ACT inhaler Inhale 1-2 puffs into the lungs every 6 (six) hours as needed for wheezing or shortness of breath. 10/03/19   Avegno, Darrelyn Hillock, FNP  aspirin 81 MG tablet Take 81 mg by mouth daily.    [provider]  benzonatate (TESSALON) 100 MG capsule Take 1 capsule (100 mg total) by mouth every 8 (eight) hours. 10/03/19   Avegno, Darrelyn Hillock, FNP  budesonide (PULMICORT) 0.5 MG/2ML nebulizer solution 2 (two) times daily.  10/22/11   [provider]  buPROPion (WELLBUTRIN SR) 150 MG 12 hr tablet Take 150 mg by mouth daily.  10/18/11   [provider]  Calcium Carbonate-Vitamin D (CALCIUM + D PO) Take by mouth 2 (two) times daily.    [provider]  celecoxib (CELEBREX) 100 MG capsule Take 100 mg by mouth as needed.     [provider]  citalopram (CELEXA) 20 MG tablet Take 20 mg by mouth daily.    [provider]  DEXILANT 60 MG capsule Take 60 mg by mouth daily.  04/23/12   [provider]  diclofenac sodium (VOLTAREN) 1 % GEL APPLY 2 TO 4 GRAMS TO EACH KNEE UP TO QID 06/06/18   [provider]  fluticasone (FLONASE) 50 MCG/ACT nasal spray Place 1 spray into both nostrils daily for 14 days. 10/03/19 10/17/19  Avegno, Darrelyn Hillock, FNP  HYDROcodone-acetaminophen (NORCO/VICODIN) 5-325 MG tablet Take 1 tablet by mouth every 6 (six) hours as needed for moderate pain. 10/07/19   Edrick Kins, DPM  hyoscyamine (LEVSIN) 0.125 MG tablet Take by mouth. 06/24/18   [provider]  ipratropium (ATROVENT) 0.06 % nasal spray Place into the nose.    [provider]  levothyroxine (SYNTHROID, LEVOTHROID) 25 MCG tablet Take by mouth.    [provider]  lovastatin (MEVACOR) 40 MG tablet Take by mouth.    [provider]  lubiprostone (AMITIZA) 8 MCG capsule Take 24 mcg by mouth 2 (two) times daily with a  meal.     [provider]  meloxicam (MOBIC) 15 MG tablet Take 1 tablet (15 mg total) by mouth daily. 11/03/19   Edrick Kins, DPM  methylPREDNISolone (MEDROL DOSEPAK) 4 MG TBPK tablet 6 day dose pack - take as directed 12/13/20   Edrick Kins, DPM  montelukast (SINGULAIR) 10 MG tablet Take 10 mg by mouth every morning.  12/28/11   [provider]  ondansetron (ZOFRAN) 4 MG tablet Take 1 tablet (4 mg total) by mouth every 8 (eight) hours as needed for nausea or vomiting. 10/03/19   Avegno, Darrelyn Hillock, FNP  ondansetron (ZOFRAN-ODT) 4 MG disintegrating tablet Take 4 mg by mouth every 12 (twelve) hours as needed.  03/03/12   [provider]  ranitidine (ZANTAC) 150 MG tablet TAKE ONE TABLET BY MOUTH EVERY NIGHT AT BEDTIME 12/17/16   [provider]  traZODone (DESYREL) 50 MG tablet Take 50  mg by mouth as needed.     [provider]  vitamin C (ASCORBIC ACID) 500 MG tablet Take 1,200 mg by mouth daily.     [provider]  Vitamin D, Ergocalciferol, (DRISDOL) 50000 units CAPS capsule daily.  04/29/17   [provider]    Family History Family History  Problem Relation Age of Onset   Cancer Father        Prostate,Lung   COPD Father    Leukemia Sister    Cancer Brother        Prostate   Heart attack Brother    Cancer Brother        Prostate   Heart attack Brother     Social History Social History   Tobacco Use   Smoking status: Never   Smokeless tobacco: Never  Vaping Use   Vaping Use: Never used  Substance Use Topics   Alcohol use: Yes    Alcohol/week: 3.0 standard drinks    Types: 3 Glasses of wine per week    Comment: social   Drug use: No     Allergies   Amoxicillin, Amoxicillin-pot clavulanate, Versed [midazolam], Biaxin [clarithromycin], Demerol [meperidine], Oxycodone, Percocet [oxycodone-acetaminophen], Sulfamethoxazole-trimethoprim, and Tetracyclines & related   Review of Systems Review of Systems   Constitutional:  Negative for chills and fever.  Respiratory:  Negative for cough and shortness of breath.   Cardiovascular:  Negative for chest pain and palpitations.  Gastrointestinal:  Negative for abdominal pain and vomiting.  Musculoskeletal:  Negative for gait problem and joint swelling.  Skin:  Positive for color change and wound.  Neurological:  Negative for weakness and numbness.  All other systems reviewed and are negative.   Physical Exam Triage Vital Signs ED Triage Vitals  Enc Vitals Group     BP      Pulse      Resp      Temp      Temp src      SpO2      Weight      Height      Head Circumference      Peak Flow      Pain Score      Pain Loc      Pain Edu?      Excl. in [place]?    No data found.  Updated Vital Signs BP 123/80 (BP Location: Left Arm)   Pulse 76   Temp 98.5 F (36.9 C) (Oral)   Resp 18   SpO2 95%   Visual Acuity Right Eye Distance:   Left Eye Distance:   Bilateral Distance:    Right Eye Near:   Left Eye Near:    Bilateral Near:     Physical Exam Vitals and nursing note reviewed.  Constitutional:      General: She is not in acute distress.    Appearance: She is well-developed. She is not ill-appearing.  HENT:     Head: Normocephalic and atraumatic.     Mouth/Throat:     Mouth: Mucous membranes are moist.  Eyes:     Conjunctiva/sclera: Conjunctivae normal.  Cardiovascular:     Rate and Rhythm: Normal rate and regular rhythm.     Heart sounds: Normal heart sounds.  Pulmonary:     Effort: Pulmonary effort is normal. No respiratory distress.     Breath sounds: Normal breath sounds.  Abdominal:     Palpations: Abdomen is soft.     Tenderness: There is no abdominal tenderness.  Musculoskeletal:        General: No swelling or deformity. Normal range of motion.     Cervical back: Neck supple.  Skin:    General: Skin is warm and dry.     Capillary Refill: Capillary refill takes less than 2 seconds.     Findings: Erythema and  lesion present.     Comments: Abrasions to right lower leg.  See pictures for details.    Neurological:     General: No focal deficit present.     Mental Status: She is alert and oriented to person, place, and time.     Sensory: No sensory deficit.     Motor: No weakness.     Gait: Gait normal.  Psychiatric:        Mood and Affect: Mood normal.        Behavior: Behavior normal.        UC Treatments / Results  Labs (all labs ordered are listed, but only abnormal results are displayed) Labs Reviewed - No data to display  EKG   Radiology No results found.  Procedures Procedures (including critical care time)  Medications Ordered in UC Medications - No data to display  Initial Impression / Assessment and Plan / UC Course  I have reviewed the triage vital signs and the nursing notes.  Pertinent labs & imaging results that were available during my care of the patient were reviewed by me and considered in my medical decision making (see chart for details).  Abrasions on right lower leg with cellulitis.  Treating with cephalexin.  Wound care instructions and signs of worsening infection discussed with patient at length.  Instructed her to go to the ED if she has signs of worsening infection.  She agrees to plan of care.   Final Clinical Impressions(s) / UC Diagnoses   Final diagnoses:  Cellulitis of leg, right  Abrasion of right lower extremity, initial encounter     Discharge Instructions      Take the antibiotic as directed.    Keep your wounds clean and dry.  Wash it gently twice a day with soap and water.  Apply an antibiotic cream twice a day.    Go to the emergency department if you see signs of worsening infection, such as increased redness, pus-like drainage, warmth, fever, chills, or other concerning symptoms.        ED Prescriptions     Medication Sig Dispense Auth. Provider   cephALEXin (KEFLEX) 500 MG capsule Take 1 capsule (500 mg total) by mouth  4 (four) times daily. 28 capsule Sharion Balloon, NP      PDMP not reviewed this encounter.   Sharion Balloon, NP 03/30/21 [phone]

## 2021-03-30 NOTE — ED Triage Notes (Signed)
Pt presents today with c/o of right lower leg abrasions. She reports falling yesterday while gardening yesterday. She has been applying topical antibiotic cream.

## 2021-03-30 NOTE — Discharge Instructions (Addendum)
Take the antibiotic as directed.    Keep your wounds clean and dry.  Wash it gently twice a day with soap and water.  Apply an antibiotic cream twice a day.    Go to the emergency department if you see signs of worsening infection, such as increased redness, pus-like drainage, warmth, fever, chills, or other concerning symptoms.

## 2021-04-21 ENCOUNTER — Ambulatory Visit: Payer: Medicare Other | Attending: Internal Medicine

## 2021-04-21 DIAGNOSIS — Z23 Encounter for immunization: Secondary | ICD-10-CM

## 2021-04-21 NOTE — Progress Notes (Signed)
   Covid-19 Vaccination Clinic  Name:  Tamara Williams    MRN: [id number] DOB: [birth year]  04/21/2021  [name] was observed post Covid-19 immunization for 15 minutes without incident. She was provided with Vaccine Information Sheet and instruction to access the V-Safe system.   [name] was instructed to call 911 with any severe reactions post vaccine: Difficulty breathing  Swelling of face and throat  A fast heartbeat  A bad rash all over body  Dizziness and weakness   Immunizations Administered     Name Date Dose VIS Date Route   PFIZER Comrnaty(Gray TOP) Covid-19 Vaccine 04/21/2021  9:19 AM 0.3 mL 09/08/2020 Intramuscular   Manufacturer: [place]   Lot: [id number]   [place]: [phone]

## 2021-04-24 ENCOUNTER — Other Ambulatory Visit: Payer: Self-pay

## 2021-04-24 ENCOUNTER — Encounter: Payer: Self-pay | Admitting: Hematology & Oncology

## 2021-04-24 MED ORDER — PFIZER-BIONT COVID-19 VAC-TRIS 30 MCG/0.3ML IM SUSP
INTRAMUSCULAR | 0 refills | Status: DC
  Filled 2021-04-24: qty 0.3, 1d supply, fill #0

## 2021-04-25 ENCOUNTER — Other Ambulatory Visit: Payer: Self-pay

## 2021-04-25 ENCOUNTER — Ambulatory Visit (INDEPENDENT_AMBULATORY_CARE_PROVIDER_SITE_OTHER): Payer: Medicare Other | Admitting: Sports Medicine

## 2021-04-25 VITALS — BP 113/66 | Ht 60.5 in | Wt 198.0 lb

## 2021-04-25 DIAGNOSIS — M1712 Unilateral primary osteoarthritis, left knee: Secondary | ICD-10-CM

## 2021-04-25 NOTE — Progress Notes (Addendum)
   Subjective:    Patient ID: Tamara Williams, female    DOB: [birth year], 70 y.o.   MRN: [id number]  HPI chief complaint: Bilateral knee pain  Tamara Williams presents today to discuss bilateral knee pain due to DJD.  She has been a longtime patient at [facility].  Dr. Amada Jupiter performed arthroscopy on both knees, most recent one was on the left knee in 2017.  She has also had viscosupplementation in the past which has worked well for her.  She was most recently treated by Dr. Alfonso Ramus with a cortisone injection in the left knee which has been somewhat helpful.  In addition to pain, she also endorses the left knee locking up on her.  Her pain is diffuse around the knee.  She is ready to discuss merits of total knee arthroplasty and is here today for my opinion regarding this.  Past medical history reviewed Medications reviewed Allergies reviewed    Review of Systems As above    Objective:   Physical Exam  Well-developed, well-nourished.  No acute distress  Examination of both knees shows range of motion from 0 to 120 degrees.  1+ patellofemoral crepitus bilaterally.  She does have well-healed surgical incisions consistent with her prior arthroscopies.  No effusion.  Minimal tenderness to palpation.  Knees are grossly stable to ligamentous exam.  Neurovascularly intact distally.       Assessment & Plan:   Bilateral knee pain, left greater than right, secondary to DJD  Patient's previous viscosupplementation was done by Dr. Layne Benton.  She is going to return to Dr. Layne Benton for another round.  She would also like to consider her options for total knee arthroplasty.  We discussed referral to Dr. Leonia Corona, or Griffin Basil.  She will contact me if she needs my help in arranging for consultation with any of the surgeons.  I would also like to review the recent x-rays of her left knee which were done at [facility].  She will drop those off at her convenience.  In addition to the  osteoarthritis, I would like to see if she has any obvious loose bodies which may explain her knee locking up.  Addendum (05/09/2021): X-rays from [facility] are reviewed.  There is advanced medial compartmental narrowing but not quite bone-on-bone.  No evidence of loose body on the lateral.  Voicemail was left on the patient's cell phone.  I do think that based on these x-rays another round of viscosupplementation is reasonable if she decides to go that route.

## 2021-08-10 ENCOUNTER — Other Ambulatory Visit: Payer: Self-pay

## 2021-08-10 ENCOUNTER — Ambulatory Visit: Payer: Medicare Other | Attending: Internal Medicine

## 2021-08-10 DIAGNOSIS — Z23 Encounter for immunization: Secondary | ICD-10-CM

## 2021-08-10 MED ORDER — PFIZER COVID-19 VAC BIVALENT 30 MCG/0.3ML IM SUSP
INTRAMUSCULAR | 0 refills | Status: DC
  Filled 2021-08-10: qty 0.3, 1d supply, fill #0

## 2021-08-10 NOTE — Progress Notes (Signed)
   Covid-19 Vaccination Clinic  Name:  Tamara Williams    MRN: [id number] DOB: 1950-10-15  08/10/2021  Tamara Williams was observed post Covid-19 immunization for 15 minutes without incident. She was provided with Vaccine Information Sheet and instruction to access the V-Safe system.   Tamara Williams was instructed to call 911 with any severe reactions post vaccine: Difficulty breathing  Swelling of face and throat  A fast heartbeat  A bad rash all over body  Dizziness and weakness   Immunizations Administered     Name Date Dose VIS Date Route   Pfizer Covid-19 Vaccine Bivalent Booster 08/10/2021 11:35 AM 0.3 mL 05/31/2021 Intramuscular   Manufacturer: [place]   Lot: [id number]   [place]: [phone]

## 2021-10-13 ENCOUNTER — Other Ambulatory Visit: Payer: Self-pay

## 2021-10-13 ENCOUNTER — Ambulatory Visit
Admission: EM | Admit: 2021-10-13 | Discharge: 2021-10-13 | Disposition: A | Discharge status: Home / Self Care | Payer: Medicare Other | Attending: Family Medicine | Admitting: Family Medicine

## 2021-10-13 DIAGNOSIS — R1032 Left lower quadrant pain: Secondary | ICD-10-CM

## 2021-10-13 DIAGNOSIS — R509 Fever, unspecified: Secondary | ICD-10-CM

## 2021-10-13 DIAGNOSIS — R112 Nausea with vomiting, unspecified: Secondary | ICD-10-CM | POA: Diagnosis not present

## 2021-10-13 LAB — POCT URINALYSIS DIP (MANUAL ENTRY)
Bilirubin, UA: NEGATIVE
Blood, UA: NEGATIVE
Glucose, UA: NEGATIVE mg/dL
Nitrite, UA: NEGATIVE
Protein Ur, POC: 30 mg/dL — AB
Spec Grav, UA: 1.025 (ref 1.010–1.025)
Urobilinogen, UA: 1 E.U./dL
pH, UA: 6 (ref 5.0–8.0)

## 2021-10-13 MED ORDER — CIPROFLOXACIN HCL 500 MG PO TABS: 500.0000 mg | ORAL_TABLET | Freq: Two times a day (BID) | ORAL | 0 refills | Status: DC

## 2021-10-13 MED ORDER — ONDANSETRON HCL 4 MG/2ML IJ SOLN
4.0000 mg | Freq: Once | INTRAMUSCULAR | Status: AC
  Administered 2021-10-13: 4 mg via INTRAMUSCULAR

## 2021-10-13 MED ORDER — METRONIDAZOLE 500 MG PO TABS: 500.0000 mg | ORAL_TABLET | Freq: Three times a day (TID) | ORAL | 0 refills | Status: DC

## 2021-10-13 NOTE — ED Provider Notes (Addendum)
[facility]    CSN: [id number] Arrival date & time: 10/13/21  1720      History   Chief Complaint Chief Complaint  Patient presents with   Diverticulitis    HPI Tamara Williams is a 71 y.o. female.   Patient presenting today with several day history of progressively worsening left lower abdomen pain, loose bowel movements with mucus, now some constipation, nausea, vomiting today.  Also feeling like she has a low-grade fever and chills today.  She has a history of diverticulitis in this area and states this feels very consistent with those flareups.  She denies new medications or new foods, recent sick contacts, upper respiratory symptoms.  Took some over-the-counter pain relievers and Zofran with very minimal relief.   Past Medical History:  Diagnosis Date   Abnormal EKG 11/19/2018   Anemia    Anxiety    Anxiety and depression 11/19/2018   Arthritis    Asthma    Chronic sciatica    Constipation    Degenerative disc disease    Depression    GERD (gastroesophageal reflux disease)    Heart murmur    Hypercholesterolemia 11/19/2018   Hypothyroidism    Irritable bowel syndrome (IBS)    Nausea and vomiting    Sciatica 11/19/2018   Thyroid disease    Vertigo    with sinus issues.  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   GERD (gastroesophageal reflux disease)-recurrent 03/02/2013   Other iron deficiency anemia 10/05/2009   OTHER ACQUIRED DEFORMITY OF ANKLE AND FOOT OTHER 10/05/2009   Other acquired deformities of unspecified foot 10/05/2009   Other  hammer toe(s) (acquired), unspecified foot 10/05/2009   METATARSALGIA 09/14/2009   FOOT PAIN, BILATERAL 09/14/2009   UNEQUAL LEG LENGTH 09/14/2009   ABNORMALITY OF GAIT 09/14/2009   Enthesopathy 09/14/2009    Past Surgical History:  Procedure Laterality Date   ABDOMINAL HYSTERECTOMY  09/15/1988   BREAST LUMPECTOMY Left 2002   CARPAL TUNNEL RELEASE Right    CHOLECYSTECTOMY     COLONOSCOPY WITH PROPOFOL N/A 11/11/2017   Procedure: COLONOSCOPY WITH PROPOFOL;  Surgeon: Lollie Sails, MD;  Location: [facility] ENDOSCOPY;  Service: Endoscopy;  Laterality: N/A;   COLONOSCOPY WITH PROPOFOL N/A 07/03/2018   Procedure: COLONOSCOPY WITH PROPOFOL;  Surgeon: Lollie Sails, MD;  Location: [facility] ENDOSCOPY;  Service: Endoscopy;  Laterality: N/A;   ESOPHAGOGASTRODUODENOSCOPY (EGD) WITH PROPOFOL N/A 11/11/2017   Procedure: ESOPHAGOGASTRODUODENOSCOPY (EGD) WITH PROPOFOL;  Surgeon: Lollie Sails, MD;  Location: [facility] ENDOSCOPY;  Service: Endoscopy;  Laterality: N/A;   FOOT SURGERY  01/03/2010   GASTROPLASTY  07/21/2010   Lap nissan   KNEE ARTHROSCOPY Right 2015   KNEE ARTHROSCOPY WITH LATERAL MENISECTOMY Right 03/08/2016   Procedure: KNEE ARTHROSCOPY WITH PARTIAL LATERAL MENISECTOMY;  Surgeon: Ninetta Lights, MD;  Location: [place];  Service: Orthopedics;  Laterality: Right;   KNEE ARTHROSCOPY WITH MEDIAL MENISECTOMY Right 03/08/2016   Procedure: RIGHT KNEE ARTHROSCOPY CHONDROPLASTY WITH MEDIAL MENISCECTOMY;  Surgeon: Ninetta Lights, MD;  Location: [place];  Service: Orthopedics;  Laterality: Right;  nissen fundoplication with gastroplasty     OVARY SURGERY  07/19/1988   Removal   SHOULDER SURGERY Left 1996   TONSILLECTOMY      OB History   No obstetric history on file.      Home Medications    Prior to Admission medications   Medication Sig Start Date End Date Taking? Authorizing Provider  ciprofloxacin (CIPRO) 500 MG tablet Take 1 tablet (500 mg  total) by mouth 2 (two) times daily. 10/13/21  Yes Volney American, PA-C  metroNIDAZOLE (FLAGYL) 500 MG tablet Take 1 tablet (500 mg total) by mouth 3 (three) times daily. 10/13/21  Yes Volney American, PA-C  albuterol (VENTOLIN HFA) 108 (90 Base) MCG/ACT inhaler Inhale 1-2 puffs into the lungs every 6 (six) hours as needed for wheezing or shortness of breath. 10/03/19   Avegno, Darrelyn Hillock, FNP  aspirin 81 MG tablet Take 81 mg by mouth daily.    [provider]  benzonatate (TESSALON) 100 MG capsule Take 1 capsule (100 mg total) by mouth every 8 (eight) hours. 10/03/19   Avegno, Darrelyn Hillock, FNP  budesonide (PULMICORT) 0.5 MG/2ML nebulizer solution 2 (two) times daily.  10/22/11   [provider]  buPROPion (WELLBUTRIN SR) 150 MG 12 hr tablet Take 150 mg by mouth daily.  10/18/11   [provider]  Calcium Carbonate-Vitamin D (CALCIUM + D PO) Take by mouth 2 (two) times daily.    [provider]  celecoxib (CELEBREX) 100 MG capsule Take 100 mg by mouth as needed.     [provider]  cephALEXin (KEFLEX) 500 MG capsule Take 1 capsule (500 mg total) by mouth 4 (four) times daily. 03/30/21   Sharion Balloon, NP  citalopram (CELEXA) 20 MG tablet Take 20 mg by mouth daily.    [provider]  COVID-19 mRNA bivalent vaccine, Pfizer, (PFIZER COVID-19 VAC BIVALENT) injection Inject into the muscle. 08/10/21   Carlyle Basques, MD  COVID-19 mRNA Vac-TriS, Pfizer, (PFIZER-BIONT COVID-19 VAC-TRIS) SUSP injection Inject into the muscle. 04/21/21   Carlyle Basques, MD  DEXILANT 60 MG capsule Take 60 mg by mouth daily.  04/23/12   [provider]  diclofenac sodium (VOLTAREN) 1 % GEL APPLY 2 TO 4 GRAMS TO EACH KNEE UP TO QID 06/06/18   [provider]  fluticasone (FLONASE) 50 MCG/ACT nasal spray Place 1 spray into both nostrils daily for 14 days. 10/03/19 10/17/19  Avegno, Darrelyn Hillock, FNP  HYDROcodone-acetaminophen (NORCO/VICODIN) 5-325 MG tablet  Take 1 tablet by mouth every 6 (six) hours as needed for moderate pain. 10/07/19   Edrick Kins, DPM  hyoscyamine (LEVSIN) 0.125 MG tablet Take by mouth. 06/24/18   [provider]  ipratropium (ATROVENT) 0.06 % nasal spray Place into the nose.    [provider]  levothyroxine (SYNTHROID, LEVOTHROID) 25 MCG tablet Take by mouth.    [provider]  lovastatin (MEVACOR) 40 MG tablet Take by mouth.    [provider]  lubiprostone (AMITIZA) 8 MCG capsule Take 24 mcg by mouth 2 (two) times daily with a meal.     [provider]  meloxicam (MOBIC) 15 MG tablet Take 1 tablet (15 mg total) by mouth daily. 11/03/19   Edrick Kins, DPM  methylPREDNISolone (MEDROL DOSEPAK) 4 MG TBPK tablet 6 day dose pack - take as directed 12/13/20   Edrick Kins, DPM  montelukast (SINGULAIR) 10 MG tablet Take 10 mg by mouth every morning.  12/28/11   [provider]  ondansetron (ZOFRAN) 4 MG tablet Take 1 tablet (4 mg total) by mouth every 8 (eight) hours as needed for nausea or vomiting. 10/03/19   Avegno, Darrelyn Hillock, FNP  ondansetron (ZOFRAN-ODT) 4 MG disintegrating tablet Take 4 mg by mouth every 12 (twelve) hours as needed.  03/03/12   [provider]  ranitidine (ZANTAC) 150 MG tablet TAKE ONE TABLET BY MOUTH EVERY NIGHT AT BEDTIME 12/17/16   [provider]  traZODone (DESYREL) 50 MG tablet Take 50 mg by mouth as needed.     [provider]  vitamin C (ASCORBIC ACID) 500 MG tablet Take 1,200 mg by mouth daily.     [provider]  Vitamin D, Ergocalciferol, (DRISDOL) 50000 units CAPS capsule daily.  04/29/17   [provider]    Family History Family History  Problem Relation Age of Onset   Cancer Father        Prostate,Lung   COPD Father    Leukemia Sister    Cancer Brother        Prostate   Heart attack Brother    Cancer Brother        Prostate   Heart attack Brother     Social History Social History    Tobacco Use   Smoking status: Never   Smokeless tobacco: Never  Vaping Use   Vaping Use: Never used  Substance Use Topics   Alcohol use: Yes    Alcohol/week: 3.0 standard drinks    Types: 3 Glasses of wine per week    Comment: rarely   Drug use: No     Allergies   Amoxicillin, Amoxicillin-pot clavulanate, Versed [midazolam], Biaxin [clarithromycin], Demerol [meperidine], Oxycodone, Percocet [oxycodone-acetaminophen], Sulfamethoxazole-trimethoprim, and Tetracyclines & related   Review of Systems Review of Systems Per HPI  Physical Exam Triage Vital Signs ED Triage Vitals  Enc Vitals Group     BP 10/13/21 1800 112/74     Pulse Rate 10/13/21 1800 (!) 103     Resp 10/13/21 1800 16     Temp 10/13/21 1800 99.3 F (37.4 C)     Temp Source 10/13/21 1800 Oral     SpO2 10/13/21 1800 95 %     Weight --      Height --      Head Circumference --      Peak Flow --      Pain Score 10/13/21 1757 5     Pain Loc --      Pain Edu? --      Excl. in [place]? --    No data found.  Updated Vital Signs BP 112/74 (BP Location: Right Arm)    Pulse (!) 103    Temp 99.3 F (37.4 C) (Oral)    Resp 16    SpO2 95%   Visual Acuity Right Eye Distance:   Left Eye Distance:   Bilateral Distance:    Right Eye Near:   Left Eye Near:    Bilateral Near:     Physical Exam Vitals and nursing note reviewed.  Constitutional:      Appearance: Normal appearance. She is not ill-appearing.  HENT:     Head: Atraumatic.     Mouth/Throat:     Mouth: Mucous membranes are moist.  Eyes:     Extraocular Movements: Extraocular movements intact.     Conjunctiva/sclera: Conjunctivae normal.  Cardiovascular:     Rate and Rhythm: Normal rate and regular rhythm.     Heart sounds: Normal heart  sounds.  Pulmonary:     Effort: Pulmonary effort is normal.     Breath sounds: No wheezing or rales.  Abdominal:     General: Bowel sounds are normal. There is no distension.     Palpations: Abdomen is soft.  There is no mass.     Tenderness: There is abdominal tenderness. There is no right CVA tenderness, left CVA tenderness, guarding or rebound.     Comments: Mild to moderate left lower quadrant tenderness to palpation without distention or guarding  Musculoskeletal:        General: Normal range of motion.     Cervical back: Normal range of motion and neck supple.  Skin:    General: Skin is warm and dry.  Neurological:     Mental Status: She is alert and oriented to person, place, and time.  Psychiatric:        Mood and Affect: Mood normal.        Thought Content: Thought content normal.        Judgment: Judgment normal.     UC Treatments / Results  Labs (all labs ordered are listed, but only abnormal results are displayed) Labs Reviewed  POCT URINALYSIS DIP (MANUAL ENTRY) - Abnormal; Notable for the following components:      Result Value   Color, UA orange (*)    Ketones, POC UA small (15) (*)    Protein Ur, POC =30 (*)    Leukocytes, UA Trace (*)    All other components within normal limits    EKG   Radiology No results found.  Procedures Procedures (including critical care time)  Medications Ordered in UC Medications  ondansetron (ZOFRAN) injection 4 mg (4 mg Intramuscular Given 10/13/21 1838)    Initial Impression / Assessment and Plan / UC Course  I have reviewed the triage vital signs and the nursing notes.  Pertinent labs & imaging results that were available during my care of the patient were reviewed by me and considered in my medical decision making (see chart for details).     Mildly tachycardic in triage, otherwise vital signs reassuring today.  Exam revealing left lower quadrant tenderness to palpation but otherwise no red flag signs.  UA without evidence of a significant urinary tract infection.  Symptoms consistent with diverticulitis which she has had numerous flareups of in the past.  We will start Cipro and Flagyl and monitor closely for symptomatic  benefit.  IM Zofran also given prior to discharge as she states the p.o. Zofran did not fully resolve her nausea.  ED for any worsening symptoms.  Final Clinical Impressions(s) / UC Diagnoses   Final diagnoses:  LLQ pain  Fever, unspecified  Nausea and vomiting, unspecified vomiting type     Discharge Instructions      Go to the emergency department if worsening at any time.    ED Prescriptions     Medication Sig Dispense Auth. Provider   ciprofloxacin (CIPRO) 500 MG tablet Take 1 tablet (500 mg total) by mouth 2 (two) times daily. 14 tablet Volney American, [place]   metroNIDAZOLE (FLAGYL) 500 MG tablet Take 1 tablet (500 mg total) by mouth 3 (three) times daily. 21 tablet Volney American, [place]      PDMP not reviewed this encounter.   Volney American, [place] 10/13/21 1905    Volney American, [place] 10/13/21 1905

## 2021-10-13 NOTE — ED Triage Notes (Signed)
Patient states she has diverticulitis.   Patient states for the past few days she was having Mucus in her bowel   Patient states she has some pain in her lower left abdomin  Patient states she is also constipated and is having a hard time urinating.  Patient states that she has thrown up x3 since lunch time today.

## 2021-10-13 NOTE — Discharge Instructions (Signed)
Go to the emergency department if worsening at any time.

## 2022-01-02 ENCOUNTER — Encounter: Payer: Self-pay | Admitting: Podiatry

## 2022-01-02 ENCOUNTER — Ambulatory Visit (INDEPENDENT_AMBULATORY_CARE_PROVIDER_SITE_OTHER): Payer: Medicare Other

## 2022-01-02 ENCOUNTER — Other Ambulatory Visit: Payer: Self-pay | Admitting: Podiatry

## 2022-01-02 ENCOUNTER — Ambulatory Visit (INDEPENDENT_AMBULATORY_CARE_PROVIDER_SITE_OTHER): Payer: Medicare Other | Admitting: Podiatry

## 2022-01-02 DIAGNOSIS — S92912A Unspecified fracture of left toe(s), initial encounter for closed fracture: Secondary | ICD-10-CM

## 2022-01-02 DIAGNOSIS — S99922A Unspecified injury of left foot, initial encounter: Secondary | ICD-10-CM

## 2022-01-02 MED ORDER — GENTAMICIN SULFATE 0.1 % EX CREA: 1.0000 "application " | TOPICAL_CREAM | Freq: Two times a day (BID) | CUTANEOUS | 1 refills | Status: DC

## 2022-01-02 NOTE — Progress Notes (Signed)
? ?HPI: 71 y.o. female presenting today for new complaint regarding an injury to the left great toe.  Patient states that she was walking her dog out of her home when she heard a pop on her toe and noticed bleeding.  She denies stubbing her toe.  She went to an urgent care who diagnosed her with a toe fracture.  She presents for further treatment and evaluation.  Currently she is wearing a surgical shoe ? ?Past Medical History:  ?Diagnosis Date  ? Abnormal EKG 11/19/2018  ? Anemia   ? Anxiety   ? Anxiety and depression 11/19/2018  ? Arthritis   ? Asthma   ? Chronic sciatica   ? Constipation   ? Degenerative disc disease   ? Depression   ? GERD (gastroesophageal reflux disease)   ? Heart murmur   ? Hypercholesterolemia 11/19/2018  ? Hypothyroidism   ? Irritable bowel syndrome (IBS)   ? Nausea and vomiting   ? Sciatica 11/19/2018  ? Thyroid disease   ? Vertigo   ? with sinus issues.  none since 01/17  ? ? ?Past Surgical History:  ?Procedure Laterality Date  ? ABDOMINAL HYSTERECTOMY  09/15/1988  ? BREAST LUMPECTOMY Left 2002  ? CARPAL TUNNEL RELEASE Right   ? CHOLECYSTECTOMY    ? COLONOSCOPY WITH PROPOFOL N/A 11/11/2017  ? Procedure: COLONOSCOPY WITH PROPOFOL;  Surgeon: Lollie Sails, MD;  Location: [facility] ENDOSCOPY;  Service: Endoscopy;  Laterality: N/A;  ? COLONOSCOPY WITH PROPOFOL N/A 07/03/2018  ? Procedure: COLONOSCOPY WITH PROPOFOL;  Surgeon: Lollie Sails, MD;  Location: [facility] ENDOSCOPY;  Service: Endoscopy;  Laterality: N/A;  ? ESOPHAGOGASTRODUODENOSCOPY (EGD) WITH PROPOFOL N/A 11/11/2017  ? Procedure: ESOPHAGOGASTRODUODENOSCOPY (EGD) WITH PROPOFOL;  Surgeon: Lollie Sails, MD;  Location: [facility] ENDOSCOPY;  Service: Endoscopy;  Laterality: N/A;  ? FOOT SURGERY  01/03/2010  ? GASTROPLASTY  07/21/2010  ? Lap nissan  ? KNEE ARTHROSCOPY Right 2015  ? KNEE ARTHROSCOPY WITH LATERAL MENISECTOMY Right 03/08/2016  ? Procedure: KNEE ARTHROSCOPY WITH PARTIAL LATERAL MENISECTOMY;  Surgeon: Ninetta Lights, MD;  Location:  [place];  Service: Orthopedics;  Laterality: Right;  ? KNEE ARTHROSCOPY WITH MEDIAL MENISECTOMY Right 03/08/2016  ? Procedure: RIGHT KNEE ARTHROSCOPY CHONDROPLASTY WITH MEDIAL MENISCECTOMY;  Surgeon: Ninetta Lights, MD;  Location: [place];  Service: Orthopedics;  Laterality: Right;  ? nissen fundoplication with gastroplasty    ? OVARY SURGERY  07/19/1988  ? Removal  ? SHOULDER SURGERY Left 1996  ? TONSILLECTOMY    ? ? ?Allergies  ?Allergen Reactions  ? Amoxicillin Nausea And Vomiting  ? Amoxicillin-Pot Clavulanate Nausea And Vomiting  ?  vomiting  ? Versed [Midazolam]   ? Biaxin [Clarithromycin] Nausea Only  ? Demerol [Meperidine] Itching  ? Oxycodone Rash and Itching  ?  itching  ? Percocet [Oxycodone-Acetaminophen]   ?  itch  ? Sulfamethoxazole-Trimethoprim Itching and Nausea And Vomiting  ? Tetracyclines & Related Rash  ?  itch  ? ?  ? ?Physical Exam: ?General: The patient is alert and oriented x3 in no acute distress. ? ?Dermatology: Skin is warm, dry and supple bilateral lower extremities.  No open lacerations or active bleeding noted.  The toenail plate actually appears well adhered to the underlying nailbed.  The base of the nail plate does have some subungual hematoma however there is no associated tenderness or pain ? ?Vascular: Palpable pedal pulses bilaterally. Capillary refill within normal limits.  Edema with some mild erythema noted to the left  hallux secondary to the mechanical injury ? ?Neurological: Light touch and protective threshold grossly intact ? ?Musculoskeletal Exam: No pedal deformities noted ? ?Radiographic Exam:  ?Normal osseous mineralization.  Closed, nondisplaced fracture best visualized on lateral view noted to the phalanx of the left hallux. ? ?Assessment: ?1.  Fracture left hallux, closed, nondisplaced, initial encounter ? ? ?Plan of Care:  ?1. Patient evaluated. X-Rays reviewed.  ?2.  Patient has a surgical shoe.  Continue to wear daily ?3.   Prescription for gentamicin cream.  Apply along the nail base daily with a light Band-Aid ?4.  Return to clinic 1 month for follow-up x-ray ? ?  ?  ?Edrick Kins, DPM ?[place] ? ?Dr. Edrick Kins, DPM  ?  ?[address]                                        ?[place], [place] [address]                ?Office [phone]  ?Fax [phone] ? ? ? ? ?

## 2022-02-02 ENCOUNTER — Ambulatory Visit (INDEPENDENT_AMBULATORY_CARE_PROVIDER_SITE_OTHER): Payer: Medicare Other | Admitting: Podiatry

## 2022-02-02 ENCOUNTER — Encounter: Payer: Self-pay | Admitting: Podiatry

## 2022-02-02 ENCOUNTER — Ambulatory Visit (INDEPENDENT_AMBULATORY_CARE_PROVIDER_SITE_OTHER): Payer: Medicare Other

## 2022-02-02 DIAGNOSIS — S92912A Unspecified fracture of left toe(s), initial encounter for closed fracture: Secondary | ICD-10-CM

## 2022-02-02 NOTE — Progress Notes (Signed)
? ?HPI: 71 y.o. female presenting today for follow-up evaluation of an injury to the left great toe.  Patient states that she is feeling much better.  She says she does not have any pain with the surgical shoe that she wears.  No new complaints at this time ? ?Past Medical History:  ?Diagnosis Date  ? Abnormal EKG 11/19/2018  ? Anemia   ? Anxiety   ? Anxiety and depression 11/19/2018  ? Arthritis   ? Asthma   ? Chronic sciatica   ? Constipation   ? Degenerative disc disease   ? Depression   ? GERD (gastroesophageal reflux disease)   ? Heart murmur   ? Hypercholesterolemia 11/19/2018  ? Hypothyroidism   ? Irritable bowel syndrome (IBS)   ? Nausea and vomiting   ? Sciatica 11/19/2018  ? Thyroid disease   ? Vertigo   ? with sinus issues.  none since 01/17  ? ? ?Past Surgical History:  ?Procedure Laterality Date  ? ABDOMINAL HYSTERECTOMY  09/15/1988  ? BREAST LUMPECTOMY Left 2002  ? CARPAL TUNNEL RELEASE Right   ? CHOLECYSTECTOMY    ? COLONOSCOPY WITH PROPOFOL N/A 11/11/2017  ? Procedure: COLONOSCOPY WITH PROPOFOL;  Surgeon: Lollie Sails, MD;  Location: [facility] ENDOSCOPY;  Service: Endoscopy;  Laterality: N/A;  ? COLONOSCOPY WITH PROPOFOL N/A 07/03/2018  ? Procedure: COLONOSCOPY WITH PROPOFOL;  Surgeon: Lollie Sails, MD;  Location: [facility] ENDOSCOPY;  Service: Endoscopy;  Laterality: N/A;  ? ESOPHAGOGASTRODUODENOSCOPY (EGD) WITH PROPOFOL N/A 11/11/2017  ? Procedure: ESOPHAGOGASTRODUODENOSCOPY (EGD) WITH PROPOFOL;  Surgeon: Lollie Sails, MD;  Location: [facility] ENDOSCOPY;  Service: Endoscopy;  Laterality: N/A;  ? FOOT SURGERY  01/03/2010  ? GASTROPLASTY  07/21/2010  ? Lap nissan  ? KNEE ARTHROSCOPY Right 2015  ? KNEE ARTHROSCOPY WITH LATERAL MENISECTOMY Right 03/08/2016  ? Procedure: KNEE ARTHROSCOPY WITH PARTIAL LATERAL MENISECTOMY;  Surgeon: Ninetta Lights, MD;  Location: [place];  Service: Orthopedics;  Laterality: Right;  ? KNEE ARTHROSCOPY WITH MEDIAL MENISECTOMY Right 03/08/2016  ? Procedure: RIGHT  KNEE ARTHROSCOPY CHONDROPLASTY WITH MEDIAL MENISCECTOMY;  Surgeon: Ninetta Lights, MD;  Location: [place];  Service: Orthopedics;  Laterality: Right;  ? nissen fundoplication with gastroplasty    ? OVARY SURGERY  07/19/1988  ? Removal  ? SHOULDER SURGERY Left 1996  ? TONSILLECTOMY    ? ? ?Allergies  ?Allergen Reactions  ? Amoxicillin Nausea And Vomiting  ? Amoxicillin-Pot Clavulanate Nausea And Vomiting  ?  vomiting  ? Versed [Midazolam]   ? Biaxin [Clarithromycin] Nausea Only  ? Demerol [Meperidine] Itching  ? Oxycodone Rash and Itching  ?  itching  ? Percocet [Oxycodone-Acetaminophen]   ?  itch  ? Sulfamethoxazole-Trimethoprim Itching and Nausea And Vomiting  ? Tetracyclines & Related Rash  ?  itch  ? ?  ?Physical Exam: ?General: The patient is alert and oriented x3 in no acute distress. ? ?Dermatology: Skin is warm, dry and supple bilateral lower extremities.  The edema and erythema to the toe has resolved.  Nail plate well adhered.  There is some transverse subungual hematoma along the base of the nail plate which is stable and not painful ? ?Vascular: Palpable pedal pulses bilaterally. Capillary refill within normal limits.  Edema with some mild erythema noted to the left hallux secondary to the mechanical injury ? ?Neurological: Light touch and protective threshold grossly intact ? ?Musculoskeletal Exam: No pedal deformities noted ? ?Radiographic Exam:  ?Normal osseous mineralization.  Closed, nondisplaced fracture best  visualized today on lateral oblique view noted to the phalanx of the left hallux which appears to demonstrate good routine healing. ? ?Assessment: ?1.  Fracture left hallux, closed, nondisplaced, subsequent encounter with routine healing ? ? ?Plan of Care:  ?1. Patient evaluated. X-Rays reviewed.  Good demonstration of healing ?2.  Patient may begin to transition out of the surgical shoe into good supportive sneakers as tolerated ?3.  Return to clinic as needed ?  ?  ?Edrick Kins, DPM ?[place] ? ?Dr. Edrick Kins, DPM  ?  ?[address]                                        ?[place], [place] [address]                ?Office [phone]  ?Fax [phone] ? ? ? ? ?

## 2022-03-06 ENCOUNTER — Other Ambulatory Visit: Payer: Self-pay | Admitting: Sports Medicine

## 2022-03-06 DIAGNOSIS — M48061 Spinal stenosis, lumbar region without neurogenic claudication: Secondary | ICD-10-CM

## 2022-03-06 DIAGNOSIS — M5416 Radiculopathy, lumbar region: Secondary | ICD-10-CM

## 2022-03-07 ENCOUNTER — Ambulatory Visit
Admission: RE | Admit: 2022-03-07 | Discharge: 2022-03-07 | Disposition: A | Discharge status: Home / Self Care | Payer: Medicare Other | Source: Ambulatory Visit | Attending: Sports Medicine | Admitting: Sports Medicine

## 2022-03-07 DIAGNOSIS — M48061 Spinal stenosis, lumbar region without neurogenic claudication: Secondary | ICD-10-CM

## 2022-03-07 DIAGNOSIS — M5416 Radiculopathy, lumbar region: Secondary | ICD-10-CM

## 2022-03-17 ENCOUNTER — Other Ambulatory Visit: Payer: Medicare Other

## 2022-05-29 ENCOUNTER — Ambulatory Visit (INDEPENDENT_AMBULATORY_CARE_PROVIDER_SITE_OTHER): Payer: Medicare Other

## 2022-05-29 ENCOUNTER — Ambulatory Visit (INDEPENDENT_AMBULATORY_CARE_PROVIDER_SITE_OTHER): Payer: Medicare Other | Admitting: Podiatry

## 2022-05-29 DIAGNOSIS — L989 Disorder of the skin and subcutaneous tissue, unspecified: Secondary | ICD-10-CM

## 2022-05-29 NOTE — Progress Notes (Signed)
Chief Complaint  Patient presents with   Foot Problem    Patient is here for bump on right side of foot that is sore.    Subjective: 71 y.o. female presenting to the office today for complaint of tenderness and pain associated to the lateral aspect of the right foot.  Patient states that she walks around the house barefoot and has developed symptomatic calluses especially to the lateral aspect of the right foot.  She presents for further treatment and evaluation   Past Medical History:  Diagnosis Date   Abnormal EKG 11/19/2018   Anemia    Anxiety    Anxiety and depression 11/19/2018   Arthritis    Asthma    Chronic sciatica    Constipation    Degenerative disc disease    Depression    GERD (gastroesophageal reflux disease)    Heart murmur    Hypercholesterolemia 11/19/2018   Hypothyroidism    Irritable bowel syndrome (IBS)    Nausea and vomiting    Sciatica 11/19/2018   Thyroid disease    Vertigo    with sinus issues.  none since 01/17    Past Surgical History:  Procedure Laterality Date   ABDOMINAL HYSTERECTOMY  09/15/1988   BREAST LUMPECTOMY Left 2002   CARPAL TUNNEL RELEASE Right    CHOLECYSTECTOMY     COLONOSCOPY WITH PROPOFOL N/A 11/11/2017   Procedure: COLONOSCOPY WITH PROPOFOL;  Surgeon: Lollie Sails, MD;  Location: [facility] ENDOSCOPY;  Service: Endoscopy;  Laterality: N/A;   COLONOSCOPY WITH PROPOFOL N/A 07/03/2018   Procedure: COLONOSCOPY WITH PROPOFOL;  Surgeon: Lollie Sails, MD;  Location: [facility] ENDOSCOPY;  Service: Endoscopy;  Laterality: N/A;   ESOPHAGOGASTRODUODENOSCOPY (EGD) WITH PROPOFOL N/A 11/11/2017   Procedure: ESOPHAGOGASTRODUODENOSCOPY (EGD) WITH PROPOFOL;  Surgeon: Lollie Sails, MD;  Location: [facility] ENDOSCOPY;  Service: Endoscopy;  Laterality: N/A;   FOOT SURGERY  01/03/2010   GASTROPLASTY  07/21/2010   Lap nissan   KNEE ARTHROSCOPY Right 2015   KNEE ARTHROSCOPY WITH LATERAL MENISECTOMY Right 03/08/2016   Procedure: KNEE ARTHROSCOPY  WITH PARTIAL LATERAL MENISECTOMY;  Surgeon: Ninetta Lights, MD;  Location: [facility];  Service: Orthopedics;  Laterality: Right;   KNEE ARTHROSCOPY WITH MEDIAL MENISECTOMY Right 03/08/2016   Procedure: RIGHT KNEE ARTHROSCOPY CHONDROPLASTY WITH MEDIAL MENISCECTOMY;  Surgeon: Ninetta Lights, MD;  Location: [place];  Service: Orthopedics;  Laterality: Right;   nissen fundoplication with gastroplasty     OVARY SURGERY  07/19/1988   Removal   SHOULDER SURGERY Left 1996   TONSILLECTOMY      Allergies  Allergen Reactions   Amoxicillin Nausea And Vomiting   Amoxicillin-Pot Clavulanate Nausea And Vomiting    vomiting   Versed [Midazolam]    Biaxin [Clarithromycin] Nausea Only   Demerol [Meperidine] Itching   Oxycodone Rash and Itching    itching   Percocet [Oxycodone-Acetaminophen]     itch   Sulfamethoxazole-Trimethoprim Itching and Nausea And Vomiting   Tetracyclines & Related Rash    itch     Objective:  Physical Exam General: Alert and oriented x3 in no acute distress  Dermatology: Hyperkeratotic lesion(s) present on the lateral aspect of the right foot at the fifth metatarsal tubercle. Pain on palpation with a central nucleated core noted. Skin is warm, dry and supple bilateral lower extremities. Negative for open lesions or macerations.  Vascular: Palpable pedal pulses bilaterally. No edema or erythema noted. Capillary refill within normal limits.  Neurological: Epicritic and protective threshold grossly  intact bilaterally.   Musculoskeletal Exam: Pain on palpation at the keratotic lesion(s) noted. Range of motion within normal limits bilateral. Muscle strength 5/5 in all groups bilateral.  Assessment: 1.  Symptomatic callus; benign skin lesion   Plan of Care:  1. Patient evaluated 2. Excisional debridement of keratoic lesion(s) using a chisel blade was performed without incident as a courtesy for the patient.  3. Dressed area with light  dressing. 4. Patient is to return to the clinic PRN.   Edrick Kins, DPM [facility]  Dr. Edrick Kins, DPM    [address], [place] [address]                Office [phone]  Fax [phone]

## 2022-06-11 ENCOUNTER — Ambulatory Visit: Admission: RE | Admit: 2022-06-11 | Payer: Medicare Other | Source: Home / Self Care

## 2022-06-11 ENCOUNTER — Encounter: Admission: RE | Payer: Self-pay | Source: Home / Self Care

## 2022-06-11 SURGERY — COLONOSCOPY WITH PROPOFOL
Anesthesia: General

## 2022-07-27 ENCOUNTER — Encounter: Payer: Self-pay | Admitting: Podiatry

## 2022-07-27 ENCOUNTER — Ambulatory Visit (INDEPENDENT_AMBULATORY_CARE_PROVIDER_SITE_OTHER): Payer: Medicare Other | Admitting: Podiatry

## 2022-07-27 DIAGNOSIS — L600 Ingrowing nail: Secondary | ICD-10-CM | POA: Diagnosis not present

## 2022-07-27 DIAGNOSIS — M79675 Pain in left toe(s): Secondary | ICD-10-CM

## 2022-07-27 DIAGNOSIS — B351 Tinea unguium: Secondary | ICD-10-CM | POA: Diagnosis not present

## 2022-07-27 DIAGNOSIS — M79674 Pain in right toe(s): Secondary | ICD-10-CM | POA: Diagnosis not present

## 2022-07-27 DIAGNOSIS — R52 Pain, unspecified: Secondary | ICD-10-CM

## 2022-07-27 NOTE — Progress Notes (Signed)
   Chief Complaint  Patient presents with   Nail Problem    "I think I'm getting an ingrown toenail." N - ingrown toenail L - hallux lt D - 12/28/21 O - gradually worse C - tender, sore A - closed toe shoes T - none    Subjective: Patient presents today for evaluation of a new complaint regarding pain to the medial border left great toe. Patient is concerned for possible ingrown nail.  It is very sensitive to touch.  Patient is also requesting a nail trim today.  She is unable to trim her own nails and they are very long and symptomatic.  Patient presents today for further treatment and evaluation.  Past Medical History:  Diagnosis Date   Abnormal EKG 11/19/2018   Anemia    Anxiety    Anxiety and depression 11/19/2018   Arthritis    Asthma    Chronic sciatica    Constipation    Degenerative disc disease    Depression    GERD (gastroesophageal reflux disease)    Heart murmur    Hypercholesterolemia 11/19/2018   Hypothyroidism    Irritable bowel syndrome (IBS)    Nausea and vomiting    Sciatica 11/19/2018   Thyroid disease    Vertigo    with sinus issues.  none since 01/17    Objective:  General: Well developed, nourished, in no acute distress, alert and oriented x3   Dermatology: Skin is warm, dry and supple bilateral.  Medial border left great toe is tender with evidence of an ingrowing nail. Pain on palpation noted to the border of the nail fold. The remaining nails appear elongated and thickened and dystrophic 1-5 bilateral  Vascular: DP and PT pulses palpable.  No clinical evidence of vascular compromise  Neruologic: Grossly intact via light touch bilateral.  Musculoskeletal: No pedal deformity noted  Assesement: #1 Paronychia with ingrowing nail medial border left great toe #2 pain due to onychomycosis of toenails both  Plan of Care:  1. Patient evaluated.  Mechanical debridement of nails 1-5 bilateral performed using a nail nipper without incident or  bleeding 2. Discussed treatment alternatives and plan of care. Explained nail avulsion procedure and post procedure course to patient. 3. Patient opted for permanent partial nail avulsion of the ingrown portion of the nail.  4. Prior to procedure, local anesthesia infiltration utilized using 3 ml of a 50:50 mixture of 2% plain lidocaine and 0.5% plain marcaine in a normal hallux block fashion and a betadine prep performed.  5. Partial permanent nail avulsion with chemical matrixectomy performed using [id number] applications of phenol followed by alcohol flush.  6. Light dressing applied.  Post care instructions provided 7.  Recommend triple antibiotic ointment and a Band-Aid daily to the nail avulsion site.  8.  Return to clinic 3 for routine foot care  Edrick Kins, DPM [facility]  Dr. Edrick Kins, DPM    [address], [place] [address]                Office [phone]  Fax [phone]

## 2022-09-10 ENCOUNTER — Ambulatory Visit: Admission: RE | Admit: 2022-09-10 | Payer: Medicare Other | Source: Home / Self Care

## 2022-09-10 ENCOUNTER — Encounter: Admission: RE | Payer: Self-pay | Source: Home / Self Care

## 2022-09-10 SURGERY — COLONOSCOPY
Anesthesia: General

## 2022-10-01 HISTORY — PX: JOINT REPLACEMENT: SHX530

## 2022-10-17 ENCOUNTER — Ambulatory Visit (INDEPENDENT_AMBULATORY_CARE_PROVIDER_SITE_OTHER): Payer: Medicare Other | Admitting: Podiatry

## 2022-10-17 VITALS — BP 115/68 | HR 71

## 2022-10-17 DIAGNOSIS — M79675 Pain in left toe(s): Secondary | ICD-10-CM | POA: Diagnosis not present

## 2022-10-17 DIAGNOSIS — B351 Tinea unguium: Secondary | ICD-10-CM

## 2022-10-17 DIAGNOSIS — M79674 Pain in right toe(s): Secondary | ICD-10-CM

## 2022-10-17 NOTE — Progress Notes (Signed)
   Chief Complaint  Patient presents with   Nail Problem    Left hallux nail is lifting, right hallux has a possible ingrown toenail, patient denies any pain     Subjective: 72 y.o. female presents today for follow-up evaluation after partial nail matricectomy to the medial border of the left great toenail plate.  Patient states that she has noticed that the left great toenail is lifting up.  She would like to have it evaluated.  She had partial nail matricectomy performed to this area on 07/27/2022.  Past Medical History:  Diagnosis Date   Abnormal EKG 11/19/2018   Anemia    Anxiety    Anxiety and depression 11/19/2018   Arthritis    Asthma    Chronic sciatica    Constipation    Degenerative disc disease    Depression    GERD (gastroesophageal reflux disease)    Heart murmur    Hypercholesterolemia 11/19/2018   Hypothyroidism    Irritable bowel syndrome (IBS)    Nausea and vomiting    Sciatica 11/19/2018   Thyroid disease    Vertigo    with sinus issues.  none since 01/17    Objective: Neurovascular status intact.  Skin is warm, dry and supple. Nail and respective nail fold appears to be healing appropriately.  No open wound.  No tenderness with palpation.  There is some slight nail dystrophy along the medial border of the left hallux nail plate secondary to the procedure  Assessment: #1 s/p partial permanent nail matrixectomy medial border left great toe 07/27/2022   Plan of care: #1 patient was evaluated  #2 light debridement of the periungual debris was performed to the border of the respective toe and nail plate using a tissue nipper.  The nail was smoothed with a rotary bur. #3 patient is to return to clinic on a PRN basis.   Edrick Kins, DPM [facility]  Dr. Edrick Kins, DPM    [address], [place] [address]                Office [phone]  Fax [phone]

## 2022-11-06 ENCOUNTER — Ambulatory Visit: Payer: Medicare Other | Admitting: Podiatry

## 2022-11-12 ENCOUNTER — Other Ambulatory Visit (HOSPITAL_COMMUNITY): Payer: Self-pay

## 2022-11-12 ENCOUNTER — Other Ambulatory Visit: Payer: Self-pay

## 2022-11-12 ENCOUNTER — Encounter: Payer: Self-pay | Admitting: Hematology & Oncology

## 2022-11-12 MED ORDER — DEXLANSOPRAZOLE 60 MG PO CPDR
60.0000 mg | DELAYED_RELEASE_CAPSULE | Freq: Every day | ORAL | 2 refills | Status: AC
  Filled 2022-11-12 – 2022-11-13 (×2): qty 30, 30d supply, fill #0
  Filled 2022-11-13: qty 90, 90d supply, fill #0

## 2022-11-13 ENCOUNTER — Other Ambulatory Visit: Payer: Self-pay

## 2022-11-13 ENCOUNTER — Encounter: Payer: Self-pay | Admitting: Hematology & Oncology

## 2022-11-13 ENCOUNTER — Other Ambulatory Visit (HOSPITAL_COMMUNITY): Payer: Self-pay

## 2022-11-19 ENCOUNTER — Other Ambulatory Visit (HOSPITAL_COMMUNITY): Payer: Self-pay

## 2023-02-19 ENCOUNTER — Encounter: Payer: Self-pay | Admitting: Podiatry

## 2023-02-19 ENCOUNTER — Ambulatory Visit (INDEPENDENT_AMBULATORY_CARE_PROVIDER_SITE_OTHER): Payer: Medicare Other | Admitting: Podiatry

## 2023-02-19 ENCOUNTER — Ambulatory Visit (INDEPENDENT_AMBULATORY_CARE_PROVIDER_SITE_OTHER): Payer: Medicare Other

## 2023-02-19 VITALS — BP 108/56 | HR 72

## 2023-02-19 DIAGNOSIS — M2042 Other hammer toe(s) (acquired), left foot: Secondary | ICD-10-CM | POA: Diagnosis not present

## 2023-02-19 DIAGNOSIS — M7752 Other enthesopathy of left foot: Secondary | ICD-10-CM | POA: Diagnosis not present

## 2023-02-20 NOTE — Progress Notes (Signed)
Chief Complaint  Patient presents with   Toe Injury    "I was walking barefoot up the stairs and I heard my toe pop." N - toe pain L - 4th left D - yesterday morning O - suddenly C - sore A - tennis shoes, bend it backwards T - wrapped it with paper tape so I could wear my tennis shoe.    HPI: 72 y.o. female presenting today for new complaint of pain and tenderness associated to the left toe.  Patient states that yesterday morning she bent her toe backwards and heard a pop.  She has had continued pain ever since.  She does have a history of surgery to this toe.  She has been wrapping her toe and presents today for further treatment and evaluation.  Past Medical History:  Diagnosis Date   Abnormal EKG 11/19/2018   Anemia    Anxiety    Anxiety and depression 11/19/2018   Arthritis    Asthma    Chronic sciatica    Constipation    Degenerative disc disease    Depression    GERD (gastroesophageal reflux disease)    Heart murmur    Hypercholesterolemia 11/19/2018   Hypothyroidism    Irritable bowel syndrome (IBS)    Nausea and vomiting    Sciatica 11/19/2018   Thyroid disease    Vertigo    with sinus issues.  none since 01/17    Past Surgical History:  Procedure Laterality Date   ABDOMINAL HYSTERECTOMY  09/15/1988   BREAST LUMPECTOMY Left 2002   CARPAL TUNNEL RELEASE Right    CHOLECYSTECTOMY     COLONOSCOPY WITH PROPOFOL N/A 11/11/2017   Procedure: COLONOSCOPY WITH PROPOFOL;  Surgeon: Christena Deem, MD;  Location: [facility] ENDOSCOPY;  Service: Endoscopy;  Laterality: N/A;   COLONOSCOPY WITH PROPOFOL N/A 07/03/2018   Procedure: COLONOSCOPY WITH PROPOFOL;  Surgeon: Christena Deem, MD;  Location: [facility] ENDOSCOPY;  Service: Endoscopy;  Laterality: N/A;   ESOPHAGOGASTRODUODENOSCOPY (EGD) WITH PROPOFOL N/A 11/11/2017   Procedure: ESOPHAGOGASTRODUODENOSCOPY (EGD) WITH PROPOFOL;  Surgeon: Christena Deem, MD;  Location: [facility] ENDOSCOPY;  Service: Endoscopy;  Laterality: N/A;    FOOT SURGERY  01/03/2010   GASTROPLASTY  07/21/2010   Lap nissan   KNEE ARTHROSCOPY Right 2015   KNEE ARTHROSCOPY WITH LATERAL MENISECTOMY Right 03/08/2016   Procedure: KNEE ARTHROSCOPY WITH PARTIAL LATERAL MENISECTOMY;  Surgeon: Loreta Ave, MD;  Location: [facility];  Service: Orthopedics;  Laterality: Right;   KNEE ARTHROSCOPY WITH MEDIAL MENISECTOMY Right 03/08/2016   Procedure: RIGHT KNEE ARTHROSCOPY CHONDROPLASTY WITH MEDIAL MENISCECTOMY;  Surgeon: Loreta Ave, MD;  Location: [facility];  Service: Orthopedics;  Laterality: Right;   nissen fundoplication with gastroplasty     OVARY SURGERY  07/19/1988   Removal   SHOULDER SURGERY Left 1996   TONSILLECTOMY      Allergies  Allergen Reactions   Amoxicillin Nausea And Vomiting   Amoxicillin-Pot Clavulanate Nausea And Vomiting    vomiting   Versed [Midazolam]    Biaxin [Clarithromycin] Nausea Only   Demerol [Meperidine] Itching   Oxycodone Rash and Itching    itching   Percocet [Oxycodone-Acetaminophen]     itch   Sulfamethoxazole-Trimethoprim Itching and Nausea And Vomiting   Tetracyclines & Related Rash    itch     Physical Exam: General: The patient is alert and oriented x3 in no acute distress.  Dermatology: Skin is warm, dry and supple bilateral lower extremities.   Vascular: Palpable  pedal pulses bilaterally. Capillary refill within normal limits.  There is some edema noted associated to the left fourth toe.  Neurological: Grossly intact via light touch  Musculoskeletal Exam: No pedal deformities noted.  Overall gross alignment of the lesser digits noted.  There is some edema with associated tenderness with palpation left fourth toe  Radiographic Exam LT foot 02/19/2023:  Normal osseous mineralization.  No acute fracture identified.  Old arthroplasty of the fourth digit of the left foot is noted radiographically with routine healing.  Impression: Negative for  fracture  Assessment/Plan of Care: 1.  Injury left fourth toe  -Patient evaluated.  X-rays reviewed -Recommend conservative treatment for now.  Rest ice compression and elevation -Recommend buddy splinting to the adjacent digit daily. -Recommend good supportive tennis shoes and sneakers -Return to clinic 6 weeks       Felecia Shelling, DPM [facility]  Dr. Felecia Shelling, DPM    [address] [place], [place] [address]                Office [phone]  Fax [phone]

## 2023-03-20 ENCOUNTER — Encounter: Payer: Self-pay | Admitting: Cardiology

## 2023-03-20 ENCOUNTER — Ambulatory Visit: Payer: Medicare Other | Admitting: Cardiology

## 2023-03-20 VITALS — BP 106/69 | HR 77 | Resp 16 | Ht 61.0 in | Wt 174.0 lb

## 2023-03-20 DIAGNOSIS — E7800 Pure hypercholesterolemia, unspecified: Secondary | ICD-10-CM

## 2023-03-20 DIAGNOSIS — I358 Other nonrheumatic aortic valve disorders: Secondary | ICD-10-CM

## 2023-03-20 DIAGNOSIS — R0989 Other specified symptoms and signs involving the circulatory and respiratory systems: Secondary | ICD-10-CM

## 2023-03-20 DIAGNOSIS — Z01810 Encounter for preprocedural cardiovascular examination: Secondary | ICD-10-CM

## 2023-03-20 NOTE — Progress Notes (Signed)
Primary Physician/Referring:  Roe Rutherford, NP  Patient ID: Tamara Williams, female    DOB: 07-10-1951, 72 y.o.   MRN: [id number]  Chief Complaint  Patient presents with   Follow-up   HPI:    Tamara Williams  is a 72 y.o. female  with a medical history significant for hyperlipidemia, hypothyroidism, asthma, anxiety, GERD, presents for preoperative cardiovascular risk stratification, scheduled for left knee replacement.  She remains asymptomatic.  She now has adopted a dog, with a past 1-1/2 years, she has been walking regularly and has lost about 30 pounds in weight.  Except for knee pain, no specific complaints.  Past Medical History:  Diagnosis Date   Abnormal EKG 11/19/2018   Anemia    Anxiety    Anxiety and depression 11/19/2018   Arthritis    Asthma    Chronic sciatica    Constipation    Degenerative disc disease    Depression    GERD (gastroesophageal reflux disease)    Heart murmur    Hypercholesterolemia 11/19/2018   Hypothyroidism    Irritable bowel syndrome (IBS)    Nausea and vomiting    Sciatica 11/19/2018   Thyroid disease    Vertigo    with sinus issues.  none since 01/17   Past Surgical History:  Procedure Laterality Date   ABDOMINAL HYSTERECTOMY  09/15/1988   BREAST LUMPECTOMY Left 2002   CARPAL TUNNEL RELEASE Right    CHOLECYSTECTOMY     COLONOSCOPY WITH PROPOFOL N/A 11/11/2017   Procedure: COLONOSCOPY WITH PROPOFOL;  Surgeon: Christena Deem, MD;  Location: [facility] ENDOSCOPY;  Service: Endoscopy;  Laterality: N/A;   COLONOSCOPY WITH PROPOFOL N/A 07/03/2018   Procedure: COLONOSCOPY WITH PROPOFOL;  Surgeon: Christena Deem, MD;  Location: [facility] ENDOSCOPY;  Service: Endoscopy;  Laterality: N/A;   ESOPHAGOGASTRODUODENOSCOPY (EGD) WITH PROPOFOL N/A 11/11/2017   Procedure: ESOPHAGOGASTRODUODENOSCOPY (EGD) WITH PROPOFOL;  Surgeon: Christena Deem, MD;  Location: [facility]COPY;  Service: Endoscopy;  Laterality: N/A;   FOOT SURGERY  01/03/2010    GASTROPLASTY  07/21/2010   Lap nissan   KNEE ARTHROSCOPY Right 2015   KNEE ARTHROSCOPY WITH LATERAL MENISECTOMY Right 03/08/2016   Procedure: KNEE ARTHROSCOPY WITH PARTIAL LATERAL MENISECTOMY;  Surgeon: Loreta Ave, MD;  Location: [facility];  Service: Orthopedics;  Laterality: Right;   KNEE ARTHROSCOPY WITH MEDIAL MENISECTOMY Right 03/08/2016   Procedure: RIGHT KNEE ARTHROSCOPY CHONDROPLASTY WITH MEDIAL MENISCECTOMY;  Surgeon: Loreta Ave, MD;  Location: [facility];  Service: Orthopedics;  Laterality: Right;   nissen fundoplication with gastroplasty     OVARY SURGERY  07/19/1988   Removal   SHOULDER SURGERY Left 1996   TONSILLECTOMY     Family History  Problem Relation Age of Onset   Cancer Father        Prostate,Lung   COPD Father    Leukemia Sister    Cancer Brother        Prostate   Heart attack Brother    Cancer Brother        Prostate   Heart attack Brother     Social History   Tobacco Use   Smoking status: Never   Smokeless tobacco: Never  Substance Use Topics   Alcohol use: Yes    Alcohol/week: 3.0 standard drinks of alcohol    Types: 3 Glasses of wine per week    Comment: rarely   Marital Status: Married  ROS  Review of Systems  Cardiovascular:  Negative for chest  pain, dyspnea on exertion and leg swelling.   Objective      03/20/2023   10:02 AM 02/19/2023    1:26 PM 10/17/2022    9:31 AM  Vitals with BMI  Height 5\' 5"     Weight 174 lbs    BMI 28.96    Systolic 106 108 518  Diastolic 69 56 68  Pulse 77 72 71   Blood pressure 106/69, pulse 77, resp. rate 16, height 5\' 5"  (1.651 m), weight 174 lb (78.9 kg), SpO2 98 %.  Physical Exam Neck:     Vascular: Carotid bruit (bilateral) present. No JVD.  Cardiovascular:     Rate and Rhythm: Normal rate and regular rhythm.     Pulses: Intact distal pulses.     Heart sounds: S1 normal and S2 normal. Murmur heard.     Early systolic murmur is present with a grade of 2/6 at  the upper right sternal border and apex radiating to the neck.     No gallop.  Pulmonary:     Effort: Pulmonary effort is normal.     Breath sounds: Normal breath sounds.  Abdominal:     General: Bowel sounds are normal.     Palpations: Abdomen is soft.  Musculoskeletal:     Right lower leg: No edema.     Left lower leg: No edema.     Laboratory examination:  External labs:   Labs 01/18/2023: Serum glucose 88 mg, BUN 13, creatinine 0.50, EGFR 85, potassium 4.7.  Thyroid function panel normal.  Vitamin D 33.8.  B12 and folate normal.  Iron studies normal   Total cholesterol 168, triglycerides 100, HDL 74, LDL 76.  Radiology:    Cardiac Studies:   Echocardiogram 10/09/2018: Left ventricle cavity is normal in size. Normal left ventricular shape. Normal global wall motion. Doppler evidence of grade I (impaired) diastolic dysfunction, normal LAP. Calculated EF 68%. Mild aortic valve leaflet thickening. Mild aortic stenosis. Aortic valve mean gradient of 7 mmHg, Vmax of 1.8 m/s. Calculated aortic valve area by continuity equation is 1.7 cm. Mild to moderate mitral regurgitation. Mild pulmonary hypertension with estimated pulmonary artery systolic pressure 35 mmHg. Compared to previous study on 09/19/2016, mild aortic stenosis and mild pulmonary hypertension are new findings.   Nuclear stress test  09/19/2016: 1. Resting EKG demonstrates normal sinus rhythm, normal axis, poor R-wave progression. Stress EKG is non-diagnostic for ischemia as it a pharmacologic stress using Lexiscan.Stress symptoms included dyspnea. 2. Myocardial perfusion imaging is normal. Overall left ventricular systolic function was normal without regional wall motion abnormalities. The left ventricular ejection fraction was 74%.  Carotid artery duplex 04/20/2019: Right Carotid: Velocities in the right ICA are consistent with a 1-39% stenosis.  See technical statement listed above concerning the distal ICA. Left  Carotid: Velocities in the left ICA are consistent with a 1-39% stenosis.  Vertebrals: Bilateral vertebral arteries demonstrate antegrade flow.  Subclavians: Normal flow hemodynamics were seen in bilateral subclavian arteries.   EKG:   EKG 03/20/2023: Normal sinus rhythm heart rate of 73 bpm, normal axis, incomplete right bundle branch block.  Single PAC.  Normal EKG.  No change from 11/20/2018.   Medications and allergies   Allergies  Allergen Reactions   Amoxicillin Nausea And Vomiting   Amoxicillin-Pot Clavulanate Nausea And Vomiting    vomiting   Versed [Midazolam]    Biaxin [Clarithromycin] Nausea Only   Demerol [Meperidine] Itching   Oxycodone Rash and Itching    itching   Percocet [Oxycodone-Acetaminophen]  itch   Sulfamethoxazole-Trimethoprim Itching and Nausea And Vomiting   Tetracyclines & Related Rash    itch     Medication list   Current Outpatient Medications:    albuterol (VENTOLIN HFA) 108 (90 Base) MCG/ACT inhaler, Inhale 1-2 puffs into the lungs every 6 (six) hours as needed for wheezing or shortness of breath., Disp: 18 g, Rfl: 0   budesonide (PULMICORT) 0.5 MG/2ML nebulizer solution, 2 (two) times daily. , Disp: , Rfl:    Calcium Carbonate-Vitamin D (CALCIUM + D PO), Take by mouth 2 (two) times daily., Disp: , Rfl:    citalopram (CELEXA) 20 MG tablet, Take 20 mg by mouth daily., Disp: , Rfl:    DEXILANT 60 MG capsule, Take 60 mg by mouth daily. , Disp: , Rfl:    dexlansoprazole (DEXILANT) 60 MG capsule, Take 1 capsule (60 mg total) by mouth daily., Disp: 90 capsule, Rfl: 2   fluticasone (FLONASE) 50 MCG/ACT nasal spray, Place 1 spray into both nostrils daily for 14 days., Disp: 16 g, Rfl: 0   ipratropium (ATROVENT) 0.06 % nasal spray, Place into the nose., Disp: , Rfl:    levothyroxine (SYNTHROID, LEVOTHROID) 25 MCG tablet, Take by mouth., Disp: , Rfl:    lovastatin (MEVACOR) 40 MG tablet, Take by mouth., Disp: , Rfl:    lubiprostone (AMITIZA) 8 MCG capsule,  Take 24 mcg by mouth 2 (two) times daily with a meal. , Disp: , Rfl:    montelukast (SINGULAIR) 10 MG tablet, Take 10 mg by mouth every morning. , Disp: , Rfl:    ondansetron (ZOFRAN) 4 MG tablet, Take 1 tablet (4 mg total) by mouth every 8 (eight) hours as needed for nausea or vomiting., Disp: 10 tablet, Rfl: 0   ondansetron (ZOFRAN-ODT) 4 MG disintegrating tablet, Take 4 mg by mouth every 12 (twelve) hours as needed. , Disp: , Rfl:    venlafaxine XR (EFFEXOR XR) 37.5 MG 24 hr capsule, Take 37.5 mg by mouth daily with breakfast., Disp: , Rfl:    vitamin C (ASCORBIC ACID) 500 MG tablet, Take 1,200 mg by mouth daily., Disp: , Rfl:    Vitamin D, Ergocalciferol, (DRISDOL) 50000 units CAPS capsule, daily. , Disp: , Rfl: 0   aspirin 81 MG tablet, Take 81 mg by mouth daily., Disp: , Rfl:    benzonatate (TESSALON) 100 MG capsule, Take 1 capsule (100 mg total) by mouth every 8 (eight) hours., Disp: 21 capsule, Rfl: 0   buPROPion (WELLBUTRIN SR) 150 MG 12 hr tablet, Take 150 mg by mouth daily. , Disp: , Rfl:    cephALEXin (KEFLEX) 500 MG capsule, Take 1 capsule (500 mg total) by mouth 4 (four) times daily., Disp: 28 capsule, Rfl: 0   ciprofloxacin (CIPRO) 500 MG tablet, Take 1 tablet (500 mg total) by mouth 2 (two) times daily., Disp: 14 tablet, Rfl: 0   diclofenac sodium (VOLTAREN) 1 % GEL, APPLY 2 TO 4 GRAMS TO EACH KNEE UP TO QID, Disp: , Rfl: 1   gentamicin cream (GARAMYCIN) 0.1 %, Apply 1 application. topically 2 (two) times daily., Disp: 30 g, Rfl: 1   HYDROcodone-acetaminophen (NORCO/VICODIN) 5-325 MG tablet, Take 1 tablet by mouth every 6 (six) hours as needed for moderate pain., Disp: 30 tablet, Rfl: 0   methylPREDNISolone (MEDROL DOSEPAK) 4 MG TBPK tablet, 6 day dose pack - take as directed, Disp: 21 tablet, Rfl: 0   metroNIDAZOLE (FLAGYL) 500 MG tablet, Take 1 tablet (500 mg total) by mouth 3 (three) times daily., Disp:  21 tablet, Rfl: 0   ranitidine (ZANTAC) 150 MG tablet, TAKE ONE TABLET BY  MOUTH EVERY NIGHT AT BEDTIME, Disp: , Rfl:    traZODone (DESYREL) 50 MG tablet, Take 50 mg by mouth as needed. , Disp: , Rfl:   Assessment     ICD-10-CM   1. Pre-operative cardiovascular examination  Z01.810 EKG 12-Lead    2. Hypercholesteremia  E78.00     3. Bilateral carotid bruits  R09.89 PCV CAROTID DUPLEX (BILATERAL)    4. Aortic systolic murmur on examination  I35.8 PCV ECHOCARDIOGRAM COMPLETE       Orders Placed This Encounter  Procedures   EKG 12-Lead   PCV ECHOCARDIOGRAM COMPLETE    Standing Status:   Future    Standing Expiration Date:   03/19/2024    No orders of the defined types were placed in this encounter.   Medications Discontinued During This Encounter  Medication Reason   celecoxib (CELEBREX) 100 MG capsule    meloxicam (MOBIC) 15 MG tablet    hyoscyamine (LEVSIN) 0.125 MG tablet    COVID-19 mRNA Vac-TriS, Pfizer, (PFIZER-BIONT COVID-19 VAC-TRIS) SUSP injection    COVID-19 mRNA bivalent vaccine, Pfizer, (PFIZER COVID-19 VAC BIVALENT) injection      Recommendations:   Tamara Williams is a 72 y.o. female  with a medical history significant for hyperlipidemia, hypothyroidism, asthma, anxiety, GERD, presents for preoperative cardiovascular risk stratification, scheduled for left knee replacement.  1. Pre-operative cardiovascular examination EKG is normal.  Patient walks on a regular basis with her dog, has lost about 30 pounds in weight over the past 1-1/2 years with regular walking and remains without symptoms of chest pain or dyspnea.  Hence puts her at a low risk from cardiac standpoint. - EKG 12-Lead  2. Hypercholesteremia Lipids reviewed, LDL is at goal.  In the absence of diabetes, LDL being close to 70 is appropriate.  3. Bilateral carotid bruits She has very prominent bilateral carotid bruit.  In the past in 2020 she has had mild to moderate amount of soft plaque and also some heterogenous plaque in the carotids along with mild tortuosity.  Will  repeat carotid artery duplex.  Unless they show significant abnormality or progression of carotid stenosis, she does not need any further cardiac restratification including stress testing.  She can be taken up for surgery with low risk. - PCV CAROTID DUPLEX (BILATERAL); Future  4. Aortic systolic murmur on examination She has aortic sclerotic murmur and/or mild aortic stenosis.  Previously she has had mild to moderate mitral regurgitation as well.  Will obtain an echocardiogram to exclude progression of valvular disease. - PCV ECHOCARDIOGRAM COMPLETE; Future  Other orders - venlafaxine XR (EFFEXOR XR) 37.5 MG 24 hr capsule; Take 37.5 mg by mouth daily with breakfast.     Yates Decamp, MD, [facility] 03/20/2023, 10:37 AM Office: [phone]

## 2023-03-24 ENCOUNTER — Other Ambulatory Visit: Payer: Self-pay

## 2023-03-24 ENCOUNTER — Emergency Department (HOSPITAL_BASED_OUTPATIENT_CLINIC_OR_DEPARTMENT_OTHER)
Admission: EM | Admit: 2023-03-24 | Discharge: 2023-03-24 | Disposition: A | Discharge status: Home / Self Care | Payer: Medicare Other | Attending: Emergency Medicine | Admitting: Emergency Medicine

## 2023-03-24 ENCOUNTER — Encounter (HOSPITAL_BASED_OUTPATIENT_CLINIC_OR_DEPARTMENT_OTHER): Payer: Self-pay | Admitting: Emergency Medicine

## 2023-03-24 DIAGNOSIS — R42 Dizziness and giddiness: Secondary | ICD-10-CM | POA: Insufficient documentation

## 2023-03-24 DIAGNOSIS — H9201 Otalgia, right ear: Secondary | ICD-10-CM | POA: Insufficient documentation

## 2023-03-24 LAB — BASIC METABOLIC PANEL
Anion gap: 6 (ref 5–15)
BUN: 11 mg/dL (ref 8–23)
CO2: 30 mmol/L (ref 22–32)
Calcium: 9.4 mg/dL (ref 8.9–10.3)
Chloride: 105 mmol/L (ref 98–111)
Creatinine, Ser: 0.61 mg/dL (ref 0.44–1.00)
GFR, Estimated: >60 mL/min (ref >60)
Glucose, Bld: 102 mg/dL — ABNORMAL HIGH (ref 70–99)
Potassium: 4 mmol/L (ref 3.5–5.1)
Sodium: 141 mmol/L (ref 135–145)

## 2023-03-24 LAB — CBC WITH DIFFERENTIAL/PLATELET
Abs Immature Granulocytes: 0.02 10*3/uL (ref 0.00–0.07)
Basophils Absolute: 0 10*3/uL (ref 0.0–0.1)
Basophils Relative: 1 %
Eosinophils Absolute: 0.1 10*3/uL (ref 0.0–0.5)
Eosinophils Relative: 1 %
HCT: 39.9 % (ref 36.0–46.0)
Hemoglobin: 12.9 g/dL (ref 12.0–15.0)
Immature Granulocytes: 0 %
Lymphocytes Relative: 25 %
Lymphs Abs: 2 10*3/uL (ref 0.7–4.0)
MCH: 29.4 pg (ref 26.0–34.0)
MCHC: 32.3 g/dL (ref 30.0–36.0)
MCV: 90.9 fL (ref 80.0–100.0)
Monocytes Absolute: 0.7 10*3/uL (ref 0.1–1.0)
Monocytes Relative: 8 %
Neutro Abs: 5.2 10*3/uL (ref 1.7–7.7)
Neutrophils Relative %: 65 %
Platelets: 329 10*3/uL (ref 150–400)
RBC: 4.39 MIL/uL (ref 3.87–5.11)
RDW: 12.7 % (ref 11.5–15.5)
WBC: 8 10*3/uL (ref 4.0–10.5)
nRBC: 0 % (ref 0.0–0.2)

## 2023-03-24 LAB — TROPONIN I (HIGH SENSITIVITY): Troponin I (High Sensitivity): <2 ng/L (ref <18)

## 2023-03-24 LAB — CBG MONITORING, ED: Glucose-Capillary: 100 mg/dL — ABNORMAL HIGH (ref 70–99)

## 2023-03-24 NOTE — ED Triage Notes (Signed)
Pt was outside Wednesday in heat and lost vision for few seconds and   resolved. Pt reports on Friday she started getting dizzy, has hx vertigo.

## 2023-03-24 NOTE — Discharge Instructions (Signed)
If you develop new or worsening or uncontrolled pain, chest pain, shortness of breath, intractable dizziness, headache, or any other new/concerning symptoms then return to the ER or call 911.

## 2023-03-24 NOTE — ED Notes (Signed)
Discharge paperwork given and verbally understood. 

## 2023-03-24 NOTE — ED Provider Notes (Signed)
[place] [facility]PARTMENT AT [facility] Provider Note   CSN: [id number] Arrival date & time: 03/24/23  1238     History  Chief Complaint  Patient presents with   Dizziness    Tamara Williams is a 72 y.o. female.  HPI 72 year old female with a history of chronic vertigo presents with vertigo starting 2 days ago, right ear pain starting this morning around 10 AM, and urgent cares concern for possible stroke.  Patient states that around 10 AM she was watching TV and felt a sudden pain in her right ear near her TMJ.  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  She been having vertigo for the last 2 days but it does not feel different than typical.  No new vision changes, headache, focal weakness or numbness.  She went to urgent care to see what was wrong with her ear as has been on and off hurting since.  Seems to come and go out of nowhere.  No diaphoresis, chest pain, shortness of breath, radiation.  She feels like her teeth are normal and she just went to the dentist earlier this week and has no dental pain.  She did say the dental hygienist cleaned her teeth aggressively but she's had no dental or jaw pain until the ear started hurting today. Urgent care was concerned that she might be having a stroke due to the dizziness and referred her to the ER.  She has been having intermittent symptoms that seems to be well-controlled with meclizine.  Home Medications Prior to Admission medications   Medication Sig Start Date End Date Taking? Authorizing Provider  albuterol (VENTOLIN HFA) 108 (90 Base) MCG/ACT inhaler Inhale 1-2 puffs into the lungs every 6 (six) hours as needed for wheezing or shortness of breath. 10/03/19   Avegno, Zachery Dakins, FNP  budesonide (PULMICORT) 0.5 MG/2ML nebulizer solution 2 (two) times daily.  10/22/11   [provider]  Calcium Carbonate-Vitamin D (CALCIUM + D PO) Take by mouth 2 (two)  times daily.    [provider]  citalopram (CELEXA) 20 MG tablet Take 20 mg by mouth daily.    [provider]  dexlansoprazole (DEXILANT) 60 MG capsule Take 1 capsule (60 mg total) by mouth daily. 11/08/22     ipratropium (ATROVENT) 0.06 % nasal spray Place into the nose.    [provider]  levothyroxine (SYNTHROID, LEVOTHROID) 25 MCG tablet Take by mouth.    [provider]  lovastatin (MEVACOR) 40 MG tablet Take by mouth.    [provider]  lubiprostone (AMITIZA) 8 MCG capsule Take 24 mcg by mouth 2 (two) times daily with a meal.     [provider]  montelukast (SINGULAIR) 10 MG tablet Take 10 mg by mouth every morning.  12/28/11   [provider]  ondansetron (ZOFRAN) 4 MG tablet Take 1 tablet (4 mg total) by mouth every 8 (eight) hours as needed for nausea or vomiting. 10/03/19   Avegno, Zachery Dakins, FNP  ondansetron (ZOFRAN-ODT) 4 MG disintegrating tablet Take 4 mg by mouth every 12 (twelve) hours as needed.  03/03/12   [provider]  venlafaxine XR (EFFEXOR XR) 37.5 MG 24 hr capsule Take 37.5 mg by mouth daily with breakfast. 08/31/22   [provider]  vitamin C (ASCORBIC ACID) 500 MG tablet Take 1,200 mg by mouth daily.    [provider]  Vitamin D, Ergocalciferol, (DRISDOL)  50000 units CAPS capsule daily.  04/29/17   [provider]      Allergies    Amoxicillin, Amoxicillin-pot clavulanate, Versed [midazolam], Biaxin [clarithromycin], Demerol [meperidine], Oxycodone, Percocet [oxycodone-acetaminophen], Sulfamethoxazole-trimethoprim, and Tetracyclines & related    Review of Systems   Review of Systems  Constitutional:  Negative for diaphoresis.  HENT:  Positive for ear pain. Negative for dental problem.   Eyes:  Negative for visual disturbance.  Respiratory:  Negative for shortness of breath.   Cardiovascular:  Negative for chest pain.  Gastrointestinal:  Negative for nausea and  vomiting.  Neurological:  Positive for dizziness. Negative for weakness, numbness and headaches.    Physical Exam Updated Vital Signs BP 119/68   Pulse 71   Temp 98.4 F (36.9 C) (Oral)   Resp 16   SpO2 99%  Physical Exam Vitals and nursing note reviewed.  Constitutional:      General: She is not in acute distress.    Appearance: She is well-developed. She is not ill-appearing or diaphoretic.  HENT:     Head: Normocephalic and atraumatic.     Right Ear: Tympanic membrane and ear canal normal.     Left Ear: Tympanic membrane and ear canal normal.     Ears:     Comments: No mastoid tenderness on right No TMJ tenderness or facial swelling    Mouth/Throat:     Comments: No acute dental caries, tenderness or infection Eyes:     Extraocular Movements: Extraocular movements intact.     Pupils: Pupils are equal, round, and reactive to light.  Cardiovascular:     Rate and Rhythm: Normal rate and regular rhythm.     Heart sounds: Normal heart sounds.  Pulmonary:     Effort: Pulmonary effort is normal.     Breath sounds: Normal breath sounds.  Abdominal:     Palpations: Abdomen is soft.     Tenderness: There is no abdominal tenderness.  Skin:    General: Skin is warm and dry.  Neurological:     Mental Status: She is alert.     Comments: CN 3-12 grossly intact. 5/5 strength in all 4 extremities. Grossly normal sensation. Normal finger to nose. Normal gait     ED Results / Procedures / Treatments   Labs (all labs ordered are listed, but only abnormal results are displayed) Labs Reviewed  BASIC METABOLIC PANEL - Abnormal; Notable for the following components:      Result Value   Glucose, Bld 102 (*)    All other components within normal limits  CBG MONITORING, ED - Abnormal; Notable for the following components:   Glucose-Capillary 100 (*)    All other components within normal limits  CBC WITH DIFFERENTIAL/PLATELET  TROPONIN I (HIGH SENSITIVITY)    EKG EKG  Interpretation  Date/Time:  Sunday March 24 2023 12:59:00 EDT Ventricular Rate:  69 PR Interval:  154 QRS Duration: 87 QT Interval:  382 QTC Calculation: 410 R Axis:   -24 Text Interpretation: Sinus rhythm Inferior infarct, 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  Procedures Procedures    Medications Ordered in ED Medications - No data to display  ED Course/ Medical Decision Making/ A&P                             Medical Decision Making Amount and/or Complexity of Data Reviewed External Data Reviewed:  notes.    Details: Unfortunately the only significant things I can find on Care everywhere is that she was told to go to the ER Labs: ordered.    Details: Undetectable troponin.  ECG/medicine tests: ordered and independent interpretation performed.    Details: No ischemia.    I have low suspicion that the patient's vertigo is from a stroke.  She has been dealing with vertigo for 40+ years.  No new symptoms today besides the ear discomfort which seems to be external to her ear.  Given no clear findings, I did discuss we could do troponins to make sure this is not atypical MI.  Troponin is negative after multiple hours of on and off symptoms.  Discussed we could do a second 1 but she and husband declined and would prefer to just go home.  Given already lower suspicion of ACS I think this is reasonable.  She has been ambulatory here and is showing no signs or symptoms of a stroke currently.  Will have her follow-up with her ENT but at this point there is no clear signs of an infection either in her ear or dental.  Will discharge home with return precautions.        Final Clinical Impression(s) / ED Diagnoses Final diagnoses:  Acute ear pain, right    Rx / DC Orders ED Discharge Orders     None         Pricilla Loveless, MD 03/24/23 1455

## 2023-03-26 ENCOUNTER — Encounter: Payer: Self-pay | Admitting: Cardiology

## 2023-04-12 ENCOUNTER — Ambulatory Visit: Payer: Medicare Other

## 2023-04-12 DIAGNOSIS — R0989 Other specified symptoms and signs involving the circulatory and respiratory systems: Secondary | ICD-10-CM

## 2023-04-12 DIAGNOSIS — I358 Other nonrheumatic aortic valve disorders: Secondary | ICD-10-CM

## 2023-04-16 NOTE — Progress Notes (Signed)
Carotid artery duplex 04/12/2023: Duplex suggests stenosis in the right internal carotid artery (minimal). Duplex suggests stenosis in the left internal carotid artery (minimal). Antegrade right vertebral artery flow. Antegrade left vertebral artery flow.

## 2023-05-07 ENCOUNTER — Encounter: Payer: Self-pay | Admitting: Podiatry

## 2023-05-07 ENCOUNTER — Ambulatory Visit (INDEPENDENT_AMBULATORY_CARE_PROVIDER_SITE_OTHER): Payer: Medicare Other | Admitting: Podiatry

## 2023-05-07 DIAGNOSIS — M79674 Pain in right toe(s): Secondary | ICD-10-CM

## 2023-05-07 DIAGNOSIS — M79675 Pain in left toe(s): Secondary | ICD-10-CM | POA: Diagnosis not present

## 2023-05-07 DIAGNOSIS — B351 Tinea unguium: Secondary | ICD-10-CM

## 2023-05-07 DIAGNOSIS — L989 Disorder of the skin and subcutaneous tissue, unspecified: Secondary | ICD-10-CM | POA: Diagnosis not present

## 2023-05-07 NOTE — Progress Notes (Signed)
   Chief Complaint  Patient presents with   Callouses    "This thing right here is about to drive me crazy.  I walk 2-3 miles a day with my dog.  I'm having a knee replacement next month."    SUBJECTIVE Patient presents to office today complaining of elongated, thickened nails that cause pain while ambulating in shoes.  Patient is unable to trim their own nails.  Patient also presenting to have her calluses debrided.  Patient is here for further evaluation and treatment.  Past Medical History:  Diagnosis Date   Abnormal EKG 11/19/2018   Anemia    Anxiety    Anxiety and depression 11/19/2018   Arthritis    Asthma    Chronic sciatica    Constipation    Degenerative disc disease    Depression    GERD (gastroesophageal reflux disease)    Heart murmur    Hypercholesterolemia 11/19/2018   Hypothyroidism    Irritable bowel syndrome (IBS)    Nausea and vomiting    Sciatica 11/19/2018   Thyroid disease    Vertigo    with sinus issues.  none since 01/17    Allergies  Allergen Reactions   Amoxicillin Nausea And Vomiting   Amoxicillin-Pot Clavulanate Nausea And Vomiting    vomiting   Versed [Midazolam]    Biaxin [Clarithromycin] Nausea Only   Demerol [Meperidine] Itching   Oxycodone Rash and Itching    itching   Percocet [Oxycodone-Acetaminophen]     itch   Sulfamethoxazole-Trimethoprim Itching and Nausea And Vomiting   Tetracyclines & Related Rash    itch     OBJECTIVE General Patient is awake, alert, and oriented x 3 and in no acute distress. Derm Skin is dry and supple bilateral. Negative open lesions or macerations. Remaining integument unremarkable. Nails are tender, long, thickened and dystrophic with subungual debris, consistent with onychomycosis, 1-5 bilateral. No signs of infection noted.  Hyperkeratotic symptomatic calluses noted bilateral Vasc  DP and PT pedal pulses palpable bilaterally. Temperature gradient within normal limits.  Neuro Epicritic and protective  threshold sensation grossly intact bilaterally.  Musculoskeletal Exam No symptomatic pedal deformities noted bilateral. Muscular strength within normal limits.  ASSESSMENT 1.  Pain due to onychomycosis of toenails both 2.  Symptomatic calluses bilateral feet  PLAN OF CARE -Patient evaluated today.  -Instructed to maintain good pedal hygiene and foot care.  Advised against going barefoot -Mechanical debridement of nails 1-5 bilaterally performed using a nail nipper. Filed with dremel without incident.  -Excisional debridement of the hyperkeratotic calluses was performed today using a 312 scalpel without incident or bleeding as a courtesy for the patient. -Return to clinic 3 months   Felecia Shelling, DPM [facility]  Dr. Felecia Shelling, DPM    [address] [place], [place] [address]                Office [phone]  Fax [phone]

## 2023-07-16 ENCOUNTER — Ambulatory Visit (INDEPENDENT_AMBULATORY_CARE_PROVIDER_SITE_OTHER): Payer: Medicare Other | Admitting: Podiatry

## 2023-07-16 ENCOUNTER — Ambulatory Visit (INDEPENDENT_AMBULATORY_CARE_PROVIDER_SITE_OTHER): Payer: Medicare Other

## 2023-07-16 ENCOUNTER — Encounter: Payer: Self-pay | Admitting: Podiatry

## 2023-07-16 VITALS — BP 111/62 | HR 54

## 2023-07-16 DIAGNOSIS — M7752 Other enthesopathy of left foot: Secondary | ICD-10-CM | POA: Diagnosis not present

## 2023-07-22 DIAGNOSIS — M7752 Other enthesopathy of left foot: Secondary | ICD-10-CM | POA: Diagnosis not present

## 2023-07-22 MED ORDER — BETAMETHASONE SOD PHOS & ACET 6 (3-3) MG/ML IJ SUSP
3.0000 mg | Freq: Once | INTRAMUSCULAR | Status: AC
Start: 2023-07-22 — End: 2023-07-22
  Administered 2023-07-22: 3 mg via INTRA_ARTICULAR

## 2023-07-22 NOTE — Progress Notes (Signed)
Chief Complaint  Patient presents with   Toe Injury    "I injured my toe last year.  It's still hurt in that joint and up in my toe."    HPI: 72 y.o. female presenting today for follow-up evaluation of left great toe joint pain.  She says the last injection helped significantly for several months.  Gradual recurrence of the pain.  Past Medical History:  Diagnosis Date   Abnormal EKG 11/19/2018   Anemia    Anxiety    Anxiety and depression 11/19/2018   Arthritis    Asthma    Chronic sciatica    Constipation    Degenerative disc disease    Depression    GERD (gastroesophageal reflux disease)    Heart murmur    Hypercholesterolemia 11/19/2018   Hypothyroidism    Irritable bowel syndrome (IBS)    Nausea and vomiting    Sciatica 11/19/2018   Thyroid disease    Vertigo    with sinus issues.  none since 01/17    Past Surgical History:  Procedure Laterality Date   ABDOMINAL HYSTERECTOMY  09/15/1988   BREAST LUMPECTOMY Left 2002   CARPAL TUNNEL RELEASE Right    CHOLECYSTECTOMY     COLONOSCOPY WITH PROPOFOL N/A 11/11/2017   Procedure: COLONOSCOPY WITH PROPOFOL;  Surgeon: Christena Deem, MD;  Location: [facility] ENDOSCOPY;  Service: Endoscopy;  Laterality: N/A;   COLONOSCOPY WITH PROPOFOL N/A 07/03/2018   Procedure: COLONOSCOPY WITH PROPOFOL;  Surgeon: Christena Deem, MD;  Location: [facility] ENDOSCOPY;  Service: Endoscopy;  Laterality: N/A;   ESOPHAGOGASTRODUODENOSCOPY (EGD) WITH PROPOFOL N/A 11/11/2017   Procedure: ESOPHAGOGASTRODUODENOSCOPY (EGD) WITH PROPOFOL;  Surgeon: Christena Deem, MD;  Location: [facility] ENDOSCOPY;  Service: Endoscopy;  Laterality: N/A;   FOOT SURGERY  01/03/2010   GASTROPLASTY  07/21/2010   Lap nissan   KNEE ARTHROSCOPY Right 2015   KNEE ARTHROSCOPY WITH LATERAL MENISECTOMY Right 03/08/2016   Procedure: KNEE ARTHROSCOPY WITH PARTIAL LATERAL MENISECTOMY;  Surgeon: Loreta Ave, MD;  Location: [facility];  Service: Orthopedics;  Laterality:  Right;   KNEE ARTHROSCOPY WITH MEDIAL MENISECTOMY Right 03/08/2016   Procedure: RIGHT KNEE ARTHROSCOPY CHONDROPLASTY WITH MEDIAL MENISCECTOMY;  Surgeon: Loreta Ave, MD;  Location: [facility];  Service: Orthopedics;  Laterality: Right;   nissen fundoplication with gastroplasty     OVARY SURGERY  07/19/1988   Removal   SHOULDER SURGERY Left 1996   TONSILLECTOMY      Allergies  Allergen Reactions   Amoxicillin Nausea And Vomiting   Amoxicillin-Pot Clavulanate Nausea And Vomiting    vomiting   Versed [Midazolam]    Biaxin [Clarithromycin] Nausea Only   Demerol [Meperidine] Itching   Oxycodone Rash and Itching    itching   Percocet [Oxycodone-Acetaminophen]     itch   Sulfamethoxazole-Trimethoprim Itching and Nausea And Vomiting   Tetracyclines & Related Rash    itch     Physical Exam: General: The patient is alert and oriented x3 in no acute distress.  Dermatology: Skin is warm, dry and supple bilateral lower extremities.   Vascular: Palpable pedal pulses bilaterally. Capillary refill within normal limits.  No appreciable edema.  No erythema.  Neurological: Grossly intact via light touch  Musculoskeletal Exam: Hallux valgus deformity noted left foot.  Pain with palpation and range of motion of first MTP left  Radiographic Exam LT foot 07/16/2023:  Normal osseous mineralization. Joint spaces preserved.  No fractures or osseous irregularities noted.  Assessment/Plan of Care: 1.  Hallux valgus deformity with DJD left  -Patient evaluated.  X-rays reviewed -Injection of 0.5 cc Celestone Soluspan injected into the first MTP left -OTC Motrin as needed -Recommend good supportive shoes and sneakers -Return to clinic as needed       Felecia Shelling, DPM [facility]  Dr. Felecia Shelling, DPM    [address] [place], [place] [address]                Office [phone]  Fax [phone]

## 2023-08-09 ENCOUNTER — Ambulatory Visit: Payer: Medicare Other | Admitting: Podiatry

## 2024-01-03 ENCOUNTER — Other Ambulatory Visit: Payer: Self-pay | Admitting: Orthopedic Surgery

## 2024-01-07 ENCOUNTER — Other Ambulatory Visit: Payer: Self-pay

## 2024-01-07 ENCOUNTER — Encounter (HOSPITAL_BASED_OUTPATIENT_CLINIC_OR_DEPARTMENT_OTHER): Payer: Self-pay | Admitting: Orthopedic Surgery

## 2024-01-09 ENCOUNTER — Ambulatory Visit (HOSPITAL_BASED_OUTPATIENT_CLINIC_OR_DEPARTMENT_OTHER): Admitting: Anesthesiology

## 2024-01-09 ENCOUNTER — Encounter (HOSPITAL_BASED_OUTPATIENT_CLINIC_OR_DEPARTMENT_OTHER): Payer: Self-pay | Admitting: Orthopedic Surgery

## 2024-01-09 ENCOUNTER — Ambulatory Visit (HOSPITAL_BASED_OUTPATIENT_CLINIC_OR_DEPARTMENT_OTHER)

## 2024-01-09 ENCOUNTER — Ambulatory Visit (HOSPITAL_BASED_OUTPATIENT_CLINIC_OR_DEPARTMENT_OTHER)
Admission: RE | Admit: 2024-01-09 | Discharge: 2024-01-09 | Disposition: A | Discharge status: Home / Self Care | Attending: Orthopedic Surgery | Admitting: Orthopedic Surgery

## 2024-01-09 ENCOUNTER — Other Ambulatory Visit: Payer: Self-pay

## 2024-01-09 ENCOUNTER — Encounter (HOSPITAL_BASED_OUTPATIENT_CLINIC_OR_DEPARTMENT_OTHER)
Admission: RE | Disposition: A | Discharge status: Home / Self Care | Payer: Self-pay | Source: Home / Self Care | Attending: Orthopedic Surgery

## 2024-01-09 DIAGNOSIS — K219 Gastro-esophageal reflux disease without esophagitis: Secondary | ICD-10-CM | POA: Insufficient documentation

## 2024-01-09 DIAGNOSIS — F32A Depression, unspecified: Secondary | ICD-10-CM | POA: Insufficient documentation

## 2024-01-09 DIAGNOSIS — M1990 Unspecified osteoarthritis, unspecified site: Secondary | ICD-10-CM | POA: Insufficient documentation

## 2024-01-09 DIAGNOSIS — Z1881 Retained glass fragments: Secondary | ICD-10-CM | POA: Diagnosis not present

## 2024-01-09 DIAGNOSIS — X58XXXA Exposure to other specified factors, initial encounter: Secondary | ICD-10-CM | POA: Insufficient documentation

## 2024-01-09 DIAGNOSIS — Z01818 Encounter for other preprocedural examination: Secondary | ICD-10-CM

## 2024-01-09 DIAGNOSIS — S60454A Superficial foreign body of right ring finger, initial encounter: Secondary | ICD-10-CM | POA: Diagnosis present

## 2024-01-09 DIAGNOSIS — J45909 Unspecified asthma, uncomplicated: Secondary | ICD-10-CM | POA: Diagnosis not present

## 2024-01-09 DIAGNOSIS — G709 Myoneural disorder, unspecified: Secondary | ICD-10-CM | POA: Diagnosis not present

## 2024-01-09 DIAGNOSIS — E039 Hypothyroidism, unspecified: Secondary | ICD-10-CM | POA: Insufficient documentation

## 2024-01-09 DIAGNOSIS — F418 Other specified anxiety disorders: Secondary | ICD-10-CM

## 2024-01-09 DIAGNOSIS — F419 Anxiety disorder, unspecified: Secondary | ICD-10-CM | POA: Insufficient documentation

## 2024-01-09 HISTORY — PX: EXCISION METACARPAL MASS: SHX6372

## 2024-01-09 SURGERY — EXCISION METACARPAL MASS
Anesthesia: Monitor Anesthesia Care | Site: Ring Finger | Laterality: Right

## 2024-01-09 MED ORDER — 0.9 % SODIUM CHLORIDE (POUR BTL) OPTIME
TOPICAL | Status: DC | PRN
  Administered 2024-01-09: 100 mL

## 2024-01-09 MED ORDER — ACETAMINOPHEN 10 MG/ML IV SOLN: 1000.0000 mg | Freq: Once | INTRAVENOUS | Status: DC | PRN

## 2024-01-09 MED ORDER — FENTANYL CITRATE (PF) 100 MCG/2ML IJ SOLN
INTRAMUSCULAR | Status: DC | PRN
  Administered 2024-01-09: 50 ug via INTRAVENOUS

## 2024-01-09 MED ORDER — ONDANSETRON HCL 4 MG/2ML IJ SOLN: 4.0000 mg | Freq: Once | INTRAMUSCULAR | Status: DC | PRN

## 2024-01-09 MED ORDER — DEXMEDETOMIDINE HCL IN NACL 80 MCG/20ML IV SOLN
INTRAVENOUS | Status: DC | PRN
  Administered 2024-01-09: 80 ug/h via INTRAVENOUS

## 2024-01-09 MED ORDER — ONDANSETRON HCL 4 MG/2ML IJ SOLN
INTRAMUSCULAR | Status: DC | PRN
  Administered 2024-01-09: 4 mg via INTRAVENOUS

## 2024-01-09 MED ORDER — FENTANYL CITRATE (PF) 100 MCG/2ML IJ SOLN
100.0000 ug | Freq: Once | INTRAMUSCULAR | Status: AC
  Administered 2024-01-09: 25 ug via INTRAVENOUS

## 2024-01-09 MED ORDER — FENTANYL CITRATE (PF) 100 MCG/2ML IJ SOLN
INTRAMUSCULAR | Status: AC
  Filled 2024-01-09: qty 2

## 2024-01-09 MED ORDER — LACTATED RINGERS IV SOLN: INTRAVENOUS | Status: DC

## 2024-01-09 MED ORDER — PROPOFOL 10 MG/ML IV BOLUS
INTRAVENOUS | Status: DC | PRN
  Administered 2024-01-09: 50 ug/kg/min via INTRAVENOUS

## 2024-01-09 MED ORDER — ROPIVACAINE HCL 5 MG/ML IJ SOLN
INTRAMUSCULAR | Status: DC | PRN
  Administered 2024-01-09: 30 mL via PERINEURAL

## 2024-01-09 MED ORDER — CEFAZOLIN SODIUM-DEXTROSE 2-3 GM-%(50ML) IV SOLR
INTRAVENOUS | Status: DC | PRN
  Administered 2024-01-09: 2 g via INTRAVENOUS

## 2024-01-09 MED ORDER — DEXMEDETOMIDINE HCL IN NACL 80 MCG/20ML IV SOLN
INTRAVENOUS | Status: AC
  Filled 2024-01-09: qty 20

## 2024-01-09 MED ORDER — AMISULPRIDE (ANTIEMETIC) 5 MG/2ML IV SOLN: 10.0000 mg | Freq: Once | INTRAVENOUS | Status: DC | PRN

## 2024-01-09 MED ORDER — HYDROCODONE-ACETAMINOPHEN 5-325 MG PO TABS: 1.0000 | ORAL_TABLET | Freq: Four times a day (QID) | ORAL | 0 refills | Status: DC | PRN

## 2024-01-09 SURGICAL SUPPLY — 45 items
BANDAGE GAUZE 1X75IN STRL (MISCELLANEOUS) IMPLANT
BENZOIN TINCTURE PRP APPL 2/3 (GAUZE/BANDAGES/DRESSINGS) IMPLANT
BLADE MINI RND TIP GREEN BEAV (BLADE) IMPLANT
BLADE SURG 15 STRL LF DISP TIS (BLADE) ×4 IMPLANT
BNDG COHESIVE 1X5 TAN STRL LF (GAUZE/BANDAGES/DRESSINGS) IMPLANT
BNDG COHESIVE 2X5 TAN ST LF (GAUZE/BANDAGES/DRESSINGS) IMPLANT
BNDG ELASTIC 2INX 5YD STR LF (GAUZE/BANDAGES/DRESSINGS) IMPLANT
BNDG ELASTIC 3INX 5YD STR LF (GAUZE/BANDAGES/DRESSINGS) IMPLANT
BNDG ESMARK 4X9 LF (GAUZE/BANDAGES/DRESSINGS) IMPLANT
BNDG GAUZE 1X75IN STRL (MISCELLANEOUS) IMPLANT
BNDG GAUZE DERMACEA FLUFF 4 (GAUZE/BANDAGES/DRESSINGS) IMPLANT
BNDG PLASTER X FAST 3X3 WHT LF (CAST SUPPLIES) IMPLANT
CHLORAPREP W/TINT 26 (MISCELLANEOUS) ×2 IMPLANT
CORD BIPOLAR FORCEPS 12FT (ELECTRODE) ×2 IMPLANT
COVER BACK TABLE 60X90IN (DRAPES) ×2 IMPLANT
COVER MAYO STAND STRL (DRAPES) ×2 IMPLANT
CUFF TOURN SGL QUICK 18X4 (TOURNIQUET CUFF) ×2 IMPLANT
DRAPE EXTREMITY T 121X128X90 (DISPOSABLE) ×2 IMPLANT
DRAPE SURG 17X23 STRL (DRAPES) ×2 IMPLANT
GAUZE SPONGE 4X4 12PLY STRL (GAUZE/BANDAGES/DRESSINGS) ×2 IMPLANT
GAUZE STRETCH 2X75IN STRL (MISCELLANEOUS) IMPLANT
GAUZE XEROFORM 1X8 LF (GAUZE/BANDAGES/DRESSINGS) ×2 IMPLANT
GLOVE BIO SURGEON STRL SZ7.5 (GLOVE) ×2 IMPLANT
GLOVE BIOGEL PI IND STRL 8 (GLOVE) ×2 IMPLANT
GLOVE BIOGEL PI IND STRL 8.5 (GLOVE) IMPLANT
GLOVE SURG ORTHO 8.0 STRL STRW (GLOVE) IMPLANT
GOWN STRL REUS W/ TWL LRG LVL3 (GOWN DISPOSABLE) ×2 IMPLANT
GOWN STRL REUS W/TWL XL LVL3 (GOWN DISPOSABLE) ×2 IMPLANT
NDL HYPO 25X1 1.5 SAFETY (NEEDLE) ×2 IMPLANT
NEEDLE HYPO 25X1 1.5 SAFETY (NEEDLE) IMPLANT
NS IRRIG 1000ML POUR BTL (IV SOLUTION) ×2 IMPLANT
PACK BASIN DAY SURGERY FS (CUSTOM PROCEDURE TRAY) ×2 IMPLANT
PAD CAST 3X4 CTTN HI CHSV (CAST SUPPLIES) IMPLANT
PAD CAST 4YDX4 CTTN HI CHSV (CAST SUPPLIES) IMPLANT
PADDING CAST ABS COTTON 4X4 ST (CAST SUPPLIES) ×2 IMPLANT
SPLINT FINGER 3.25 911903 (SOFTGOODS) IMPLANT
STOCKINETTE 4X48 STRL (DRAPES) ×2 IMPLANT
STRIP CLOSURE SKIN 1/2X4 (GAUZE/BANDAGES/DRESSINGS) IMPLANT
SUT ETHILON 3 0 PS 1 (SUTURE) IMPLANT
SUT ETHILON 4 0 PS 2 18 (SUTURE) ×2 IMPLANT
SYR BULB EAR ULCER 3OZ GRN STR (SYRINGE) ×2 IMPLANT
SYR CONTROL 10ML LL (SYRINGE) ×2 IMPLANT
TOWEL GREEN STERILE FF (TOWEL DISPOSABLE) ×4 IMPLANT
TRAY DSU PREP LF (CUSTOM PROCEDURE TRAY) IMPLANT
UNDERPAD 30X36 HEAVY ABSORB (UNDERPADS AND DIAPERS) ×2 IMPLANT

## 2024-01-09 NOTE — Anesthesia Postprocedure Evaluation (Signed)
 Anesthesia Post Note  Patient: Tamara Williams  Procedure(s) Performed: EXCISION FOREIGN BODY RIGHT RING FINGER (Right: Ring Finger)     Patient location during evaluation: PACU Anesthesia Type: Regional Level of consciousness: awake Pain management: pain level controlled Vital Signs Assessment: post-procedure vital signs reviewed and stable Respiratory status: spontaneous breathing, nonlabored ventilation and respiratory function stable Cardiovascular status: blood pressure returned to baseline and stable Postop Assessment: no apparent nausea or vomiting Anesthetic complications: no   No notable events documented.  Last Vitals:  Vitals:   01/09/24 1535 01/09/24 1550  BP: 124/68 128/60  Pulse:    Resp: 18 15  Temp: (!) 36.4 C 36.4 C  SpO2: 96% 95%    Last Pain:  Vitals:   01/09/24 1550  TempSrc:   PainSc: 0-No pain                 Leeandre Nordling P Cookie Pore

## 2024-01-09 NOTE — Progress Notes (Signed)
 Assisted Dr. Bradley Ferris with right, supraclavicular, ultrasound guided block. Side rails up, monitors on throughout procedure. See vital signs in flow sheet. Tolerated Procedure well.

## 2024-01-09 NOTE — Op Note (Addendum)
 NAME: Tamara Williams MEDICAL RECORD NO: [id number] DATE OF BIRTH: [birth year] FACILITY: [facility] LOCATION: [facility] PHYSICIAN: Tami Ribas, MD   OPERATIVE REPORT   DATE OF PROCEDURE: 01/09/24    PREOPERATIVE DIAGNOSIS: Foreign body right ring finger   POSTOPERATIVE DIAGNOSIS: Foreign body right ring finger   PROCEDURE: Removal deep foreign body right ring finger   SURGEON:  Betha Loa, M.D.   ASSISTANT: Cindee Salt, MD   ANESTHESIA:  Regional with sedation   INTRAVENOUS FLUIDS:  Per anesthesia flow sheet.   ESTIMATED BLOOD LOSS:  Minimal.   COMPLICATIONS:  None.   SPECIMENS:  none   TOURNIQUET TIME:    Total Tourniquet Time Documented: Upper Arm (Right) - 13 minutes Total: Upper Arm (Right) - 13 minutes    DISPOSITION:  Stable to PACU.   INDICATIONS: 73 year old female states she got a piece of glass in her right ring finger several months ago.  It is bothersome to her.  Pinches on occasion.  She wishes to have it removed.  Risks, benefits and alternatives of surgery were discussed including the risks of blood loss, infection, damage to nerves, vessels, tendons, ligaments, bone for surgery, need for additional surgery, complications with wound healing, continued pain, stiffness, retained foreign body.  She voiced understanding of these risks and elected to proceed.  OPERATIVE COURSE:  After being identified preoperatively by myself,  the patient and I agreed on the procedure and site of the procedure.  The surgical site was marked.  Surgical consent had been signed. Preoperative IV antibiotic prophylaxis was given. She was transferred to the operating room and placed on the operating table in supine position with the Right upper extremity on an arm board.  Sedation was induced by the anesthesiologist. A regional block had been performed by anesthesia in preoperative holding.    Right upper extremity was prepped and draped in normal sterile orthopedic  fashion.  A surgical pause was performed between the surgeons, anesthesia, and operating room staff and all were in agreement as to the patient, procedure, and site of procedure.  Tourniquet at the proximal aspect of the extremity was inflated to 250 mmHg after exsanguination of the arm with an Esmarch bandage.  A Bruner type incision was made at the PIP joint of the ring finger.  This was carried into subcutaneous tissues by spreading technique.  Glass foreign body was located near the apex of the incision at the ulnar side of the finger.  This was removed.  The area was examined and no remaining foreign body was noted.  Digital nerve and vessel were intact.  C-arm was used in AP and lateral projections to ensure removal of all radiopaque foreign body.  No remaining radiopaque foreign body was noted.  Wound was copiously irrigated with sterile saline.  Closed with 4-0 nylon in a horizontal mattress fashion.  Was dressed with sterile Xeroform 4 x 4 and wrapped with a Coban dressing lightly.  AlumaFoam splint was placed and wrapped lightly with Coban dressing.  The tourniquet was deflated at 13 minutes.  Fingertips were pink with brisk capillary refill after deflation of tourniquet.  The operative  drapes were broken down.  The patient was awoken from anesthesia safely.  She was transferred back to the stretcher and taken to PACU in stable condition.  I will see her back in the office in 1 week for postoperative followup.  I will give her a prescription for Norco 5/325 1 tab PO q6 hours prn  pain, dispense # 15.   Betha Loa, MD Electronically signed, 01/09/24

## 2024-01-09 NOTE — Transfer of Care (Signed)
 Immediate Anesthesia Transfer of Care Note  Patient: Tamara Williams  Procedure(s) Performed: EXCISION METACARPAL MASS (Right: Ring Finger)  Patient Location: PACU and Short Stay  Anesthesia Type:MAC combined with regional for post-op pain  Level of Consciousness: awake  Airway & Oxygen Therapy: Patient Spontanous Breathing  Post-op Assessment: Report given to RN and Post -op Vital signs reviewed and stable  Post vital signs: Reviewed and stable  Last Vitals:  Vitals Value Taken Time  BP    Temp    Pulse    Resp    SpO2      Last Pain:  Vitals:   01/09/24 1203  TempSrc: Temporal  PainSc: 0-No pain      Patients Stated Pain Goal: 0 (01/09/24 1203)  Complications: No notable events documented.

## 2024-01-09 NOTE — H&P (Signed)
 Tamara Williams is an 73 y.o. female.   Chief Complaint: foreign body HPI: 73 yo female with right ring finger foreign body.  It is bothersome to her.  She wishes to have it removed.  Allergies:  Allergies  Allergen Reactions   Amoxicillin-Pot Clavulanate Nausea And Vomiting    vomiting   Versed [Midazolam] Other (See Comments)    Shivering, shaking and "makes me crazy"   Biaxin [Clarithromycin] Nausea Only   Demerol [Meperidine] Itching   Oxycodone Rash and Itching    itching   Percocet [Oxycodone-Acetaminophen]     itch   Sulfamethoxazole-Trimethoprim Itching and Nausea And Vomiting   Tetracyclines & Related Rash    itch    Past Medical History:  Diagnosis Date   Abnormal EKG 11/19/2018   Anemia    Anxiety    Anxiety and depression 11/19/2018   Arthritis    Asthma    Chronic sciatica    Constipation    Degenerative disc disease    Depression    GERD (gastroesophageal reflux disease)    Heart murmur    Hypercholesterolemia 11/19/2018   Hypothyroidism    Irritable bowel syndrome (IBS)    Nausea and vomiting    Sciatica 11/19/2018   Thyroid disease    Vertigo    with sinus issues.  none since 01/17    Past Surgical History:  Procedure Laterality Date   ABDOMINAL HYSTERECTOMY  09/15/1988   BREAST LUMPECTOMY Left 2002   CARPAL TUNNEL RELEASE Right    CHOLECYSTECTOMY     COLONOSCOPY WITH PROPOFOL N/A 11/11/2017   Procedure: COLONOSCOPY WITH PROPOFOL;  Surgeon: Christena Deem, MD;  Location: [facility];  Service: Endoscopy;  Laterality: N/A;   COLONOSCOPY WITH PROPOFOL N/A 07/03/2018   Procedure: COLONOSCOPY WITH PROPOFOL;  Surgeon: Christena Deem, MD;  Location: [facility] ENDOSCOPY;  Service: Endoscopy;  Laterality: N/A;   ESOPHAGOGASTRODUODENOSCOPY (EGD) WITH PROPOFOL N/A 11/11/2017   Procedure: ESOPHAGOGASTRODUODENOSCOPY (EGD) WITH PROPOFOL;  Surgeon: Christena Deem, MD;  Location: [facility] ENDOSCOPY;  Service: Endoscopy;  Laterality: N/A;   FOOT SURGERY   01/03/2010   GASTROPLASTY  07/21/2010   Lap nissan   JOINT REPLACEMENT Left 2024   KNEE ARTHROSCOPY Right 2015   KNEE ARTHROSCOPY WITH LATERAL MENISECTOMY Right 03/08/2016   Procedure: KNEE ARTHROSCOPY WITH PARTIAL LATERAL MENISECTOMY;  Surgeon: Loreta Ave, MD;  Location: [facility];  Service: Orthopedics;  Laterality: Right;   KNEE ARTHROSCOPY WITH MEDIAL MENISECTOMY Right 03/08/2016   Procedure: RIGHT KNEE ARTHROSCOPY CHONDROPLASTY WITH MEDIAL MENISCECTOMY;  Surgeon: Loreta Ave, MD;  Location: [facility];  Service: Orthopedics;  Laterality: Right;   nissen fundoplication with gastroplasty     OVARY SURGERY  07/19/1988   Removal   SHOULDER SURGERY Left 1996   TONSILLECTOMY      Family History: Family History  Problem Relation Age of Onset   Cancer Father        Prostate,Lung   COPD Father    Leukemia Sister    Cancer Brother        Prostate   Heart attack Brother    Cancer Brother        Prostate   Heart attack Brother     Social History:   reports that she has never smoked. She has never used smokeless tobacco. She reports current alcohol use of about 3.0 standard drinks of alcohol per week. She reports that she does not use drugs.  Medications: Medications Prior to Admission  Medication Sig Dispense Refill   albuterol (VENTOLIN HFA) 108 (90 Base) MCG/ACT inhaler Inhale 1-2 puffs into the lungs every 6 (six) hours as needed for wheezing or shortness of breath. 18 g 0   Calcium Carbonate-Vitamin D (CALCIUM + D PO) Take by mouth 2 (two) times daily.     citalopram (CELEXA) 20 MG tablet Take 40 mg by mouth daily.     cyclobenzaprine (FEXMID) 7.5 MG tablet Take 7.5 mg by mouth 3 (three) times daily as needed for muscle spasms.     dexlansoprazole (DEXILANT) 60 MG capsule Take 1 capsule (60 mg total) by mouth daily. 90 capsule 2   diazepam (VALIUM) 10 MG tablet Take 10 mg by mouth at bedtime as needed for anxiety.     diclofenac  (VOLTAREN) 50 MG EC tablet Take 50 mg by mouth 2 (two) times daily.     HYDROcodone-acetaminophen (NORCO/VICODIN) 5-325 MG tablet Take 1 tablet by mouth every 6 (six) hours as needed for moderate pain (pain score 4-6).     ipratropium (ATROVENT) 0.06 % nasal spray Place into the nose.     levothyroxine (SYNTHROID, LEVOTHROID) 25 MCG tablet Take 50 mcg by mouth.     linaclotide (LINZESS) 72 MCG capsule Take 72 mcg by mouth daily before breakfast.     loratadine (CLARITIN) 10 MG tablet Take 10 mg by mouth at bedtime.     lovastatin (MEVACOR) 40 MG tablet Take by mouth.     montelukast (SINGULAIR) 10 MG tablet Take 10 mg by mouth at bedtime.     Multiple Vitamins-Minerals (PRESERVISION AREDS 2 PO) Take by mouth.     ondansetron (ZOFRAN-ODT) 4 MG disintegrating tablet Take 4 mg by mouth every 12 (twelve) hours as needed.      phentermine 15 MG capsule Take 15 mg by mouth every morning.     prednisoLONE acetate (PRED FORTE) 1 % ophthalmic suspension 1 drop 4 (four) times daily.     pregabalin (LYRICA) 50 MG capsule Take 50 mg by mouth at bedtime.     venlafaxine XR (EFFEXOR XR) 37.5 MG 24 hr capsule Take 37.5 mg by mouth daily with breakfast.     vitamin B-12 (CYANOCOBALAMIN) 100 MCG tablet Take 100 mcg by mouth daily.     vitamin C (ASCORBIC ACID) 500 MG tablet Take 1,200 mg by mouth daily.     budesonide (PULMICORT) 0.5 MG/2ML nebulizer solution 2 (two) times daily.       No results found for this or any previous visit (from the past 48 hours).  No results found.    Height 5\' 1"  (1.549 m), weight 70.3 kg.  General appearance: alert, cooperative, and appears stated age Head: Normocephalic, without obvious abnormality, atraumatic Neck: supple, symmetrical, trachea midline Extremities: Intact sensation and capillary refill all digits.  +epl/fpl/io.  No wounds.  Skin: Skin color, texture, turgor normal. No rashes or lesions Neurologic: Grossly normal Incision/Wound:  none  Assessment/Plan Right ring finger retained foreign body.  Likely glass.  Non operative and operative treatment options have been discussed with the patient and patient wishes to proceed with operative treatment. Risks, benefits and alternatives of surgery were discussed including risks of blood loss, infection, damage to nerves/vessels/tendons/ligament/bone, failure of surgery, need for additional surgery, complication with wound healing, stiffness, retained foreign body.  She voiced understanding of these risks and elected to proceed.    Betha Loa 01/09/2024, 11:59 AM

## 2024-01-09 NOTE — Anesthesia Procedure Notes (Signed)
 Anesthesia Regional Block: Supraclavicular block   Pre-Anesthetic Checklist: , timeout performed,  Correct Patient, Correct Site, Correct Laterality,  Correct Procedure, Correct Position, site marked,  Risks and benefits discussed,  Surgical consent,  Pre-op evaluation,  At surgeon's request and post-op pain management  Laterality: Right  Prep: chloraprep       Needles:  Injection technique: Single-shot  Needle Type: Echogenic Stimulator Needle     Needle Length: 9cm  Needle Gauge: 21     Additional Needles:   Procedures:,,,, ultrasound used (permanent image in chart),,    Narrative:  Start time: 01/09/2024 1:40 PM End time: 01/09/2024 1:50 PM Injection made incrementally with aspirations every 5 mL.  Performed by: Personally  Anesthesiologist: Leonides Grills, MD  Additional Notes: Functioning IV was confirmed and monitors were applied.  A timeout was performed. Sterile prep, hand hygiene and sterile gloves were used. A 90mm 21ga Arrow echogenic stimulator needle was used. Negative aspiration and negative test dose prior to incremental administration of local anesthetic. The patient tolerated the procedure well.  Ultrasound guidance: relevent anatomy identified, needle position confirmed, local anesthetic spread visualized around nerve(s), vascular puncture avoided.  Image printed for medical record.

## 2024-01-09 NOTE — Op Note (Signed)
 I assisted Surgeons and Role:    Betha Loa, MD - Primary on the Procedure(s): EXCISION METACARPAL MASS on 01/09/2024.  I provided assistance on this case as follows: Set up, approach, traction for isolation of all 4 body, removal of foreign body, closure wound, application of dressing and splint.  Electronically signed by: Cindee Salt, MD Date: 01/09/2024 Time: 3:31 PM

## 2024-01-09 NOTE — Anesthesia Procedure Notes (Signed)
 Procedure Name: MAC Date/Time: 01/09/2024 2:55 PM  Performed by: Yolanda Bonine, CRNAPre-anesthesia Checklist: Patient identified, Emergency Drugs available, Suction available, Patient being monitored and Timeout performed Patient Re-evaluated:Patient Re-evaluated prior to induction Oxygen Delivery Method: Simple face mask Preoxygenation: Pre-oxygenation with 100% oxygen Airway Equipment and Method: Oral airway Placement Confirmation: positive ETCO2 Dental Injury: Teeth and Oropharynx as per pre-operative assessment

## 2024-01-09 NOTE — Discharge Instructions (Addendum)

## 2024-01-09 NOTE — Anesthesia Preprocedure Evaluation (Addendum)
 Anesthesia Evaluation  Patient identified by MRN, date of birth, ID band Patient awake    Reviewed: Allergy & Precautions, NPO status , Patient's Chart, lab work & pertinent test results, reviewed documented beta blocker date and time   History of Anesthesia Complications Negative for: history of anesthetic complications  Airway Mallampati: II  TM Distance: >3 FB Neck ROM: Full    Dental no notable dental hx.    Pulmonary asthma    Pulmonary exam normal        Cardiovascular (-) hypertension(-) Past MI, (-) Cardiac Stents, (-) CABG and (-) CHF Normal cardiovascular exam+ Valvular Problems/Murmurs      Neuro/Psych  PSYCHIATRIC DISORDERS Anxiety Depression     Neuromuscular disease    GI/Hepatic ,GERD  Medicated and Controlled,,  Endo/Other  Hypothyroidism    Renal/GU Renal disease     Musculoskeletal  (+) Arthritis ,    Abdominal   Peds  Hematology  (+) Blood dyscrasia, anemia   Anesthesia Other Findings FOREIGN BODY RIGHT RING FINGER  Reproductive/Obstetrics                             Anesthesia Physical Anesthesia Plan  ASA: 3  Anesthesia Plan: Regional   Post-op Pain Management:    Induction:   PONV Risk Score and Plan: 2 and Ondansetron, Dexamethasone and Treatment may vary due to age or medical condition  Airway Management Planned: Natural Airway  Additional Equipment:   Intra-op Plan:   Post-operative Plan:   Informed Consent: I have reviewed the patients History and Physical, chart, labs and discussed the procedure including the risks, benefits and alternatives for the proposed anesthesia with the patient or authorized representative who has indicated his/her understanding and acceptance.     Dental advisory given  Plan Discussed with: CRNA and Surgeon  Anesthesia Plan Comments: (Pre-op regional anesthesia performed per patient request. Minimal intra-op sedation  requested by patient.)       Anesthesia Quick Evaluation

## 2024-01-10 ENCOUNTER — Encounter (HOSPITAL_BASED_OUTPATIENT_CLINIC_OR_DEPARTMENT_OTHER): Payer: Self-pay | Admitting: Orthopedic Surgery

## 2024-03-26 ENCOUNTER — Other Ambulatory Visit: Payer: Self-pay | Admitting: Sports Medicine

## 2024-03-26 DIAGNOSIS — M542 Cervicalgia: Secondary | ICD-10-CM

## 2024-04-09 ENCOUNTER — Other Ambulatory Visit

## 2024-06-03 ENCOUNTER — Emergency Department: Admission: EM | Admit: 2024-06-03 | Discharge: 2024-06-03 | Disposition: A | Discharge status: Home / Self Care

## 2024-06-03 ENCOUNTER — Emergency Department

## 2024-06-03 ENCOUNTER — Other Ambulatory Visit: Payer: Self-pay

## 2024-06-03 DIAGNOSIS — S50311A Abrasion of right elbow, initial encounter: Secondary | ICD-10-CM | POA: Insufficient documentation

## 2024-06-03 DIAGNOSIS — Z23 Encounter for immunization: Secondary | ICD-10-CM | POA: Insufficient documentation

## 2024-06-03 DIAGNOSIS — E039 Hypothyroidism, unspecified: Secondary | ICD-10-CM | POA: Insufficient documentation

## 2024-06-03 DIAGNOSIS — W19XXXA Unspecified fall, initial encounter: Secondary | ICD-10-CM | POA: Diagnosis not present

## 2024-06-03 DIAGNOSIS — J45909 Unspecified asthma, uncomplicated: Secondary | ICD-10-CM | POA: Diagnosis not present

## 2024-06-03 DIAGNOSIS — S50811A Abrasion of right forearm, initial encounter: Secondary | ICD-10-CM | POA: Diagnosis not present

## 2024-06-03 DIAGNOSIS — S0990XA Unspecified injury of head, initial encounter: Secondary | ICD-10-CM | POA: Diagnosis present

## 2024-06-03 DIAGNOSIS — S0181XA Laceration without foreign body of other part of head, initial encounter: Secondary | ICD-10-CM | POA: Insufficient documentation

## 2024-06-03 DIAGNOSIS — T148XXA Other injury of unspecified body region, initial encounter: Secondary | ICD-10-CM

## 2024-06-03 DIAGNOSIS — S0083XA Contusion of other part of head, initial encounter: Secondary | ICD-10-CM

## 2024-06-03 MED ORDER — CYCLOBENZAPRINE HCL 10 MG PO TABS
10.0000 mg | ORAL_TABLET | Freq: Three times a day (TID) | ORAL | 0 refills | Status: AC | PRN
Start: 2024-06-03 — End: 2024-06-07

## 2024-06-03 MED ORDER — LIDOCAINE HCL (PF) 1 % IJ SOLN
5.0000 mL | Freq: Once | INTRAMUSCULAR | Status: AC
  Administered 2024-06-03: 5 mL
  Filled 2024-06-03: qty 5

## 2024-06-03 MED ORDER — ACETAMINOPHEN 325 MG PO TABS
650.0000 mg | ORAL_TABLET | Freq: Four times a day (QID) | ORAL | Status: DC | PRN
  Administered 2024-06-03: 650 mg via ORAL
  Filled 2024-06-03: qty 2

## 2024-06-03 MED ORDER — TETANUS-DIPHTH-ACELL PERTUSSIS 5-2.5-18.5 LF-MCG/0.5 IM SUSY
0.5000 mL | PREFILLED_SYRINGE | Freq: Once | INTRAMUSCULAR | Status: AC
  Administered 2024-06-03: 0.5 mL via INTRAMUSCULAR
  Filled 2024-06-03: qty 0.5

## 2024-06-03 MED ORDER — ACETAMINOPHEN ER 650 MG PO TBCR: 650.0000 mg | EXTENDED_RELEASE_TABLET | Freq: Three times a day (TID) | ORAL | 0 refills | Status: AC | PRN

## 2024-06-03 NOTE — ED Provider Notes (Addendum)
 [facility] Provider Note    Event Date/Time   First MD Initiated Contact with Patient 06/03/24 1846     (approximate)   History   Fall    HPI  Tamara Williams is a 73 y.o. female    with a past medical history of foreign body of right finger, viral gastroenteritis, Mnire's disease, cast capsulitis of toe, major depressive disorder, seasonal allergies hypothyroidism, spinal stenosis,who presents to the ED complaining of mechanical fall. According to the patient,  She tripped and fell on her face.  Patient has a laceration on her chin, hematoma on left forehead and a skin tears to right arm and left elbow.  Patient denies loss of consciousness, patient is not taking blood thinners.  Last Tdap unknown.  Patient is here with her husband.    Patient Active Problem List   Diagnosis Date Noted  . Moderate mitral regurgitation by prior echocardiogram 11/19/2018  . Hypercholesterolemia 11/19/2018  . Abnormal EKG 11/19/2018  . Asthma 11/19/2018  . Avitaminosis D 11/19/2018  . Sciatica 11/19/2018  . Hypothyroidism 11/19/2018  . Anxiety and depression 11/19/2018  . IBS (irritable bowel syndrome) 11/19/2018  . IDA (iron deficiency anemia) 07/27/2016  . 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 GERD (gastroesophageal reflux disease)-recurrent 03/02/2013  . Other iron deficiency anemia 10/05/2009  . OTHER ACQUIRED DEFORMITY OF ANKLE AND FOOT OTHER 10/05/2009  . Other acquired deformities of unspecified foot 10/05/2009  . Other hammer toe(s) (acquired), unspecified foot 10/05/2009  . METATARSALGIA 09/14/2009  . FOOT PAIN, BILATERAL 09/14/2009  . UNEQUAL LEG LENGTH 09/14/2009  . ABNORMALITY OF GAIT 09/14/2009  . Enthesopathy 09/14/2009     ROS: Patient currently denies any vision changes, tinnitus, difficulty speaking, facial droop, sore throat, chest pain, shortness of breath, abdominal pain, nausea/vomiting/diarrhea, dysuria, or weakness/numbness/paresthesias in  any extremity   Physical Exam   Triage Vital Signs: ED Triage Vitals  Encounter Vitals Group     BP 06/03/24 1726 (!) 133/59     Girls Systolic BP Percentile --      Girls Diastolic BP Percentile --      Boys Systolic BP Percentile --      Boys Diastolic BP Percentile --      Pulse Rate 06/03/24 1726 89     Resp 06/03/24 1726 20     Temp 06/03/24 1726 97.8 F (36.6 C)     Temp Source 06/03/24 1726 Oral     SpO2 06/03/24 1726 97 %     Weight --      Height --      Head Circumference --      Peak Flow --      Pain Score 06/03/24 1725 10     Pain Loc --      Pain Education --      Exclude from Growth Chart --     Most recent vital signs: Vitals:   06/03/24 1726  BP: (!) 133/59  Pulse: 89  Resp: 20  Temp: 97.8 F (36.6 C)  SpO2: 97%     Physical Exam Vitals and nursing note reviewed.  During triage patient was hypertensive  Constitutional:      General: Awake and alert. No acute distress.  Patient has ice pack on his forehead.    Appearance: Normal appearance. The patient is normal weight.      Able to speak in complete sentences without cough or dyspnea  HENT:     Head: Normocephalic and atraumatic.  Presence  of hematoma in the left forehead, skin is intact, no bleeding.    Mouth: Mucous membranes are moist.  Eyes:     General: PERRL. Normal EOMs          Conjunctiva/sclera: Conjunctivae normal.  Nose No congestion/rhinorrhea Cy: Presence of deep laceration that involves skin and muscle.  About 4 cm length 2 cm depth.  No active bleeding  CV:                  Good peripheral perfusion.  Regular rate and rhythm  Resp:               Normal effort.  Equal breath sounds bilaterally.  Abd:                 No distention.  Soft, nontender.  No rebound or guarding.  Musculoskeletal:        General: No swelling. Normal range of motion.  Right forearm: Presence of skin abrasion  with no active bleeding Left elbow: Skin abrasion  with no active bleeding Skin:     General: Skin is warm and dry.     Capillary Refill: Capillary refill takes less than 2 seconds.     Findings: No rash.  Neurological:     Mental Status: The patient is awake and alert. MAE spontaneously. No gross focal neurologic deficits are appreciated.  Psychiatric Mood and affect are normal. Speech and behavior are normal.          ED Results / Procedures / Treatments   Labs (all labs ordered are listed, but only abnormal results are displayed) Labs Reviewed - No data to display   EKG    RADIOLOGY I independently reviewed and interpreted imaging and agree with radiologists findings.      PROCEDURES:  Critical Care performed:   .Laceration Repair  Date/Time: 06/03/2024 8:42 PM  Performed by: Janit Kast, PA-C Authorized by: Janit Kast, PA-C   Consent:    Consent obtained:  Verbal   Consent given by:  Patient   Risks discussed:  Infection, pain and poor cosmetic result Universal protocol:    Procedure explained and questions answered to patient or proxy's satisfaction: yes     Patient identity confirmed:  Verbally with patient Anesthesia:    Anesthesia method:  Local infiltration   Local anesthetic:  Lidocaine  1% WITH epi Laceration details:    Location:  Face   Face location:  Chin   Length (cm):  4   Depth (mm):  5 Pre-procedure details:    Preparation:  Patient was prepped and draped in usual sterile fashion Exploration:    Limited defect created (wound extended): no     Hemostasis achieved with:  Direct pressure   Imaging outcome: foreign body not noted     Contaminated: no   Treatment:    Area cleansed with:  Chlorhexidine    Amount of cleaning:  Standard   Irrigation solution:  Sterile saline   Irrigation volume:  400   Irrigation method:  Pressure wash   Visualized foreign bodies/material removed: no     Debridement:  None   Layers/structures repaired:  Deep subcutaneous Deep subcutaneous:    Suture size:  5-0   Suture  material:  Monocryl Skin repair:    Repair method:  Sutures   Suture size:  5-0   Suture material:  Nylon   Suture technique:  Simple interrupted   Number of sutures:  10 Approximation:    Approximation:  Close Repair type:  Repair type:  Simple Post-procedure details:    Dressing:  Adhesive bandage   Procedure completion:  Tolerated well, no immediate complications    MEDICATIONS ORDERED IN ED: Medications  Tdap (BOOSTRIX) injection 0.5 mL (0.5 mLs Intramuscular Given 06/03/24 2033)  lidocaine  (PF) (XYLOCAINE ) 1 % injection 5 mL (5 mLs Other Given 06/03/24 1945)   Clinical Course as of 06/03/24 2102  Wed Jun 03, 2024  1912 DG Femur Min 2 Views Right No acute fracture or dislocation. 2. Mild arthritic changes of the right knee.     [AE]  2039 CT Cervical Spine Wo Contrast No acute intracranial abnormality. No calvarial fracture. Small left frontal scalp hematoma. 2. No acute fracture or listhesis of the cervical spine.   [AE]  2049 Updated patient and husband with results of CT of the head and cervical spine.  Patient is ready for discharge, patient is agreeable with the plan [AE]  2101 Patient was ready for discharge and asking hours for pain medication.  I did prescribe acetaminophen , patient is allergic to oxycodone.  He usually takes acetaminophen  not ibuprofen [AE]    Clinical Course User Index [AE] Janit Kast, PA-C    IMPRESSION / MDM / ASSESSMENT AND PLAN / ED COURSE  I reviewed the triage vital signs and the nursing notes.  Differential diagnosis includes, but is not limited to, head trauma, intracranial injury, laceration, skin abrasion unlikely foreign body  Patient's presentation is most consistent with acute complicated illness / injury requiring diagnostic workup.    Tamara Williams is a 73 y.o., female presents today after mechanical fall.  Patient presents with a laceration on her chin.  Patient denies loss of consciousness and she is not taking  any blood thinners.  Physical exam patient is oriented, without neurological deficits, presence of hematoma in the left forehead, laceration in the chin, right forearm abrasion  of the skin with no bleeding, left elbow abrasion of the skin with no bleeding. Plan Tdap booster Laceration repair Wait for results of CT of the head and neck ordered during triage Patient's diagnosis is consistent with laceration of the shin, tissue injury of the left elbow and right forearm, hematoma in the left forehead. . I independently reviewed and interpreted imaging and agree with radiologists findings ruling out cervical fractures, intracranial hemorrhage, right femoral fracture. I did review the patient's allergies and medications.The patient is in stable and satisfactory condition for discharge home  Patient will be discharged home with prescriptions for Flexeril , acetaminophen . Patient is to follow up with PCP for suture in 7 days removal as needed or otherwise directed. Patient is given ED precautions to return to the ED for any worsening or new symptoms. Discussed plan of care with patient, answered all of patient's questions, Patient agreeable to plan of care. Advised patient to take medications according to the instructions on the label. Discussed possible side effects of new medications. Patient verbalized understanding.   FINAL CLINICAL IMPRESSION(S) / ED DIAGNOSES   Final diagnoses:  Fall, initial encounter  Contusion of face, initial encounter  Facial laceration, initial encounter  Abrasion     Rx / DC Orders   ED Discharge Orders          Ordered    cyclobenzaprine  (FLEXERIL ) 10 MG tablet  3 times daily PRN        06/03/24 2047    acetaminophen  (ACETAMINOPHEN  8 HOUR) 650 MG CR tablet  Every 8 hours PRN        06/03/24 2047  Note:  This document was prepared using Dragon voice recognition software and may include unintentional dictation errors.   Janit Kast,  PA-C 06/03/24 2048    Janit Kast, PA-C 06/03/24 2100    Nicholaus Rolland BRAVO, MD 06/03/24 2101

## 2024-06-03 NOTE — Discharge Instructions (Addendum)
 Have been diagnosed with fall, face laceration.  Please do not submerge your face in water.  You can remove the Band-Aid in 48 hours.  Please check for signs of infection.  You can take Flexeril  every 8 hours, do not drive if you are taking Flexeril .  You can take acetaminophen  650 mg 1 tablet by mouth every 8 hours as needed for pain.  Please come back to ED or go to your PCP if you have new symptoms or symptoms worsen.  Please make an appointment with your PCP in 7 days for suture removal.  It was a pleasure to help you

## 2024-06-03 NOTE — ED Triage Notes (Signed)
 Patient states she tripped and fell PTA; has laceration to chin, hematoma to left forehead and skin tears to right arm. Denies LOC, no thinners.

## 2024-06-03 NOTE — ED Provider Triage Note (Signed)
 Emergency Medicine Provider Triage Evaluation Note  Tamara Williams , a 73 y.o. female  was evaluated in triage.  Pt complains of fall.  Patient presents after mechanical fall with laceration on her chin, abrasion of the skin on her left elbow on right wrist.  Complains of right femur pain.  Denies loss of consciousness, patient is not taking blood thinners.  Review of Systems  Positive:  Negative:   Physical Exam  BP (!) 133/59   Pulse 89   Temp 97.8 F (36.6 C) (Oral)   Resp 20   SpO2 97%  Gen:   Awake, no distress   Resp:  Normal effort  MSK:   Moves extremities without difficulty Other:  Chin: Deep laceration, no tissue loss.  Applied dressing  Medical Decision Making  Medically screening exam initiated at 5:32 PM.  Appropriate orders placed.  Tamara Williams was informed that the remainder of the evaluation will be completed by another provider, this initial triage assessment does not replace that evaluation, and the importance of remaining in the ED until their evaluation is complete.  Patient presents with history of mechanical fall.  On physical exam presence of laceration in the chin, abrasion of the skin in the left elbow, abrasion of the skin in the right wrist.  Ordered CT of the head, CT of the neck and x-ray of the right femur.  Patient will need laceration repair   Janit Kast, PA-C 06/03/24 1734

## 2024-06-11 ENCOUNTER — Encounter: Admitting: Hematology & Oncology

## 2024-06-11 ENCOUNTER — Other Ambulatory Visit

## 2024-06-15 ENCOUNTER — Other Ambulatory Visit: Payer: Self-pay

## 2024-06-15 DIAGNOSIS — R0989 Other specified symptoms and signs involving the circulatory and respiratory systems: Secondary | ICD-10-CM

## 2024-06-15 DIAGNOSIS — R9431 Abnormal electrocardiogram [ECG] [EKG]: Secondary | ICD-10-CM

## 2024-06-15 DIAGNOSIS — E7800 Pure hypercholesterolemia, unspecified: Secondary | ICD-10-CM

## 2024-06-18 NOTE — Progress Notes (Signed)
 Subjective:   Tamara Williams is a 73 y.o. female here for evaluation of    Chief Complaint  Patient presents with  . Exposure Coronavirus (COVID-19)    Complain of body aches, headache, husband recently had Covid.   Symptom onset 48 hrs ago.  Scratchy throat, cough.  Denies SOB, fever, sweats, chills.  Rapid Flu A/B: Negative  Covid swab sent out  Review of Systems - All other systems were reviewed and are negative unless stated in HPI.  Family History  Problem Relation Age of Onset  . Dementia Mother   . Hypertension Mother   . Hyperlipidemia Mother   . Mental illness Mother   . COPD Father   . Cancer Father        Prostate  . Arthritis Father   . Hearing loss Father   . Cancer Sister        Endometrial and lung  . Arthritis Sister   . COPD Sister   . Depression Sister   . Heart disease Brother   . Cancer Brother        prostate lung brain  . Diabetes Brother   . COPD Brother   . Hyperlipidemia Brother   . Hypertension Brother   . Cancer Maternal Aunt        prostate  . Heart disease Maternal Grandmother   . Asthma Paternal Grandmother   . Cancer Paternal Grandfather   . Cancer Son        Multiple myeloma  . Cancer Sister        leukemia  . COPD Brother   . Hypertension Brother   . Depression Sister   . Kidney disease Sister   . Hypertension Brother   . Asthma Brother   . Depression Brother    Past Medical History:  Diagnosis Date  . Allergy yrs ago  . Anemia yrs ago  . Anxiety yrs ago  . Asthma (*) yrs ago  . Chronic constipation   . Colon polyp 11/2019  . Depression   . Disease of thyroid gland Yrs ago  . Diverticulitis   . GERD (gastroesophageal reflux disease) Yrs ago  . Heart murmur Yrs ago  . Hemorrhoids 11/2003  . Hyperlipidemia yrs ago  . Insomnia   . Osteoarthritis Yrs ago  . Osteopenia   . Phlebitis   . Vitamin D  deficiency    Past Surgical History:  Procedure Laterality Date  . Breast surgery Left 2001   Lumpectomy   .  Cholecystectomy  2005  . Colonoscopy  09/05/2012  . Esophagogastroduodenoscopy  11/11/2017  . Hand tendon surgery Left   . Hysterectomy    . Knee surgery    . Nissen fundoplication  2011   with gastroplasty  . Ovary surgery     Tumor removal  . Tendon repair Left    hand  . Tonsillectomy     Pediatric History  Patient Parents  . Not on file   Other Topics Concern  . Not on file  Social History Narrative  . Not on file    Objective:   BP 122/60 (BP Location: Left Upper Arm, Patient Position: Sitting)   Pulse 91   Temp 100.3 F (37.9 C) (Temporal)   Resp 20   Ht 5' 1 (1.549 m)   Wt 162 lb 6.4 oz (73.7 kg)   SpO2 98%   Breastfeeding No   BMI 30.69 kg/m  Gen: Alert, oriented, non toxic, and well hydrated.  No signs of acute distress.  Head: Normocephalic.  Atraumatic.  PERRLA.  Sclera anicteric.  Eyes: Extraocular movements intact.  Conjunctiva clear.  No foreign bodies noted. Ears:  Tympanic membranes clear.  Canals clear. Pharynx: No erythema or tonsillar hypertrophy.  Uvula midline. Neck: Supple. No lymphadenopathy Respiratory:  Lungs clear to auscultation.  No use of accessory muscles. Cardiovascular: Regular rate and rhythm.  No murmurs noted Abdominal:  Soft, non tender, non distended.  No hepatosplenomegally Neuro: Cranial nerves intact grossly.  No loss of strength, sensation Extremities:  Full range of motion.  No cyanosis, clubbing, or edema. Skin:  No rashes noted Psych: Oriented, alert.  Assessment:     ICD-10-CM   1. Body aches  R52 POCT FLU A+B(2 RESULTS)    2. Exposure to COVID-19 virus  Z20.822 COVID-19, NAA/PCR Nasal Swab Nasal Swab      Plan:   - Take all medications as prescribed. - Return to clinic to be reevaluated if symptoms worsen, persist, change, or if you have any other concerns. - I discussed this diagnosis with the patient and discussed the treatment plan with them.  This treatment plan is also outlined in the Patient  Instructions and a copy of this was provided to the patient.

## 2024-06-24 ENCOUNTER — Encounter: Admitting: Oncology

## 2024-06-30 ENCOUNTER — Encounter: Payer: Self-pay | Admitting: Oncology

## 2024-06-30 ENCOUNTER — Inpatient Hospital Stay: Attending: Oncology | Admitting: Oncology

## 2024-06-30 ENCOUNTER — Inpatient Hospital Stay

## 2024-06-30 VITALS — BP 129/62 | HR 89 | Temp 97.9°F | Resp 18 | Wt 166.9 lb

## 2024-06-30 DIAGNOSIS — Z807 Family history of other malignant neoplasms of lymphoid, hematopoietic and related tissues: Secondary | ICD-10-CM

## 2024-06-30 DIAGNOSIS — D508 Other iron deficiency anemias: Secondary | ICD-10-CM

## 2024-06-30 DIAGNOSIS — Z801 Family history of malignant neoplasm of trachea, bronchus and lung: Secondary | ICD-10-CM | POA: Diagnosis not present

## 2024-06-30 DIAGNOSIS — Z806 Family history of leukemia: Secondary | ICD-10-CM | POA: Insufficient documentation

## 2024-06-30 DIAGNOSIS — E039 Hypothyroidism, unspecified: Secondary | ICD-10-CM | POA: Insufficient documentation

## 2024-06-30 DIAGNOSIS — Z862 Personal history of diseases of the blood and blood-forming organs and certain disorders involving the immune mechanism: Secondary | ICD-10-CM | POA: Insufficient documentation

## 2024-06-30 LAB — CBC WITH DIFFERENTIAL/PLATELET
Abs Immature Granulocytes: 0.02 K/uL (ref 0.00–0.07)
Basophils Absolute: 0.1 K/uL (ref 0.0–0.1)
Basophils Relative: 1 %
Eosinophils Absolute: 0.2 K/uL (ref 0.0–0.5)
Eosinophils Relative: 2 %
HCT: 35.1 % — ABNORMAL LOW (ref 36.0–46.0)
Hemoglobin: 11.2 g/dL — ABNORMAL LOW (ref 12.0–15.0)
Immature Granulocytes: 0 %
Lymphocytes Relative: 28 %
Lymphs Abs: 2.2 K/uL (ref 0.7–4.0)
MCH: 28.6 pg (ref 26.0–34.0)
MCHC: 31.9 g/dL (ref 30.0–36.0)
MCV: 89.5 fL (ref 80.0–100.0)
Monocytes Absolute: 0.9 K/uL (ref 0.1–1.0)
Monocytes Relative: 12 %
Neutro Abs: 4.4 K/uL (ref 1.7–7.7)
Neutrophils Relative %: 57 %
Platelets: 423 K/uL — ABNORMAL HIGH (ref 150–400)
RBC: 3.92 MIL/uL (ref 3.87–5.11)
RDW: 12.4 % (ref 11.5–15.5)
WBC: 7.7 K/uL (ref 4.0–10.5)
nRBC: 0 % (ref 0.0–0.2)

## 2024-06-30 LAB — COMPREHENSIVE METABOLIC PANEL WITH GFR
ALT: 15 U/L (ref 0–44)
AST: 20 U/L (ref 15–41)
Albumin: 3.6 g/dL (ref 3.5–5.0)
Alkaline Phosphatase: 87 U/L (ref 38–126)
Anion gap: 8 (ref 5–15)
BUN: 13 mg/dL (ref 8–23)
CO2: 28 mmol/L (ref 22–32)
Calcium: 9 mg/dL (ref 8.9–10.3)
Chloride: 101 mmol/L (ref 98–111)
Creatinine, Ser: 0.71 mg/dL (ref 0.44–1.00)
GFR, Estimated: >60 mL/min (ref >60)
Glucose, Bld: 86 mg/dL (ref 70–99)
Potassium: 3.9 mmol/L (ref 3.5–5.1)
Sodium: 137 mmol/L (ref 135–145)
Total Bilirubin: 0.6 mg/dL (ref 0.0–1.2)
Total Protein: 6.8 g/dL (ref 6.5–8.1)

## 2024-06-30 LAB — IRON AND TIBC
Iron: 45 ug/dL (ref 28–170)
Saturation Ratios: 14 % (ref 10.4–31.8)
TIBC: 330 ug/dL (ref 250–450)
UIBC: 285 ug/dL

## 2024-06-30 LAB — RETIC PANEL
Immature Retic Fract: 9.2 % (ref 2.3–15.9)
RBC.: 3.95 MIL/uL (ref 3.87–5.11)
Retic Count, Absolute: 49 K/uL (ref 19.0–186.0)
Retic Ct Pct: 1.2 % (ref 0.4–3.1)
Reticulocyte Hemoglobin: 31.1 pg (ref >27.9)

## 2024-06-30 LAB — FERRITIN: Ferritin: 20 ng/mL (ref 11–307)

## 2024-06-30 NOTE — Assessment & Plan Note (Signed)
 Family history is positive for multiple myeloma.   Multiple myeloma is not classically considered an inheritable disease Epidemiologic and genomic studies consistently demonstrate that first-degree relatives of patients with multiple myeloma have increased risk of developing the disease compared to the general population  Check multiple myeloma panel and pulmonary to make sure..  She has been anemic hemoglobin, kidney function and calcium level.

## 2024-06-30 NOTE — Assessment & Plan Note (Addendum)
 Lab Results  Component Value Date   HGB 11.2 (L) 06/30/2024   TIBC 330 06/30/2024   IRONPCTSAT 14 06/30/2024   FERRITIN 20 06/30/2024    Hemoglobin is slightly decreased at 11.0.  Pending above workup.  Iron saturation is borderline normal, normal ferritin level.  Not consistent with severe iron deficiency anemia.

## 2024-06-30 NOTE — Progress Notes (Signed)
 Hematology/Oncology Consult note [phone] [phone]        REFERRING PROVIDER: Suanne Pfeiffer, NP   CHIEF COMPLAINTS/REASON FOR VISIT:  Evaluation of family history of multiple myeloma, history of iron deficiency anemia   ASSESSMENT & PLAN:   Family history of multiple myeloma Family history is positive for multiple myeloma.   Multiple myeloma is not classically considered an inheritable disease Epidemiologic and genomic studies consistently demonstrate that first-degree relatives of patients with multiple myeloma have increased risk of developing the disease compared to the general population  Check multiple myeloma panel and pulmonary to make sure..  She has been anemic hemoglobin, kidney function and calcium level.  IDA (iron deficiency anemia) Lab Results  Component Value Date   HGB 11.2 (L) 06/30/2024   TIBC 330 06/30/2024   IRONPCTSAT 14 06/30/2024   FERRITIN 20 06/30/2024    Hemoglobin is slightly decreased at 11.0.  Pending above workup.  Iron saturation is borderline normal, normal ferritin level.  Not consistent with severe iron deficiency anemia.   Orders Placed This Encounter  Procedures   Multiple Myeloma Panel (SPEP&IFE w/QIG)    Standing Status:   Future    Number of Occurrences:   1    Expected Date:   06/30/2024    Expiration Date:   09/28/2024   Kappa/lambda light chains    Standing Status:   Future    Number of Occurrences:   1    Expected Date:   06/30/2024    Expiration Date:   09/28/2024   CBC with Differential/Platelet    Standing Status:   Future    Number of Occurrences:   1    Expected Date:   06/30/2024    Expiration Date:   09/28/2024   Iron and TIBC    Standing Status:   Future    Number of Occurrences:   1    Expected Date:   06/30/2024    Expiration Date:   09/28/2024   Ferritin    Standing Status:   Future    Number of Occurrences:   1    Expected Date:   06/30/2024    Expiration Date:   09/28/2024    Retic Panel    Standing Status:   Future    Number of Occurrences:   1    Expected Date:   06/30/2024    Expiration Date:   09/28/2024   Comprehensive metabolic panel with GFR    Standing Status:   Future    Number of Occurrences:   1    Expected Date:   06/30/2024    Expiration Date:   09/28/2024  Follow-up as needed.  All questions were answered. The patient knows to call the clinic with any problems, questions or concerns.  Zelphia Cap, MD, PhD [facility] Hematology Oncology 06/30/2024   HISTORY OF PRESENTING ILLNESS:   Tamara Williams is a  73 y.o.  female with PMH listed below was seen in consultation at the request of  Suanne Pfeiffer, NP  for evaluation of amily history of multiple myeloma, history of iron deficiency anemia  She has a history of iron deficiency anemia, previously managed with iron infusions and Feraheme , which resolved her symptoms. She has not required treatment for anemia since 2013. Recent blood work in April 2025 showed normal hemoglobin and kidney function, and iron levels checked in August 2025 appeared normal.   She has a family history of multiple myeloma, with her son having passed away six  years ago at the age of 31 after an eight-year illness, including two stem cell transplants and a donor transplant. Her youngest brother was diagnosed with multiple myeloma approximately six to eight weeks ago, which affected his kidneys. She also mentions a sister with iron deficiency anemia who requires frequent blood transfusions and daily iron supplementation.  Patient reports feeling well at baseline.  Denies any unintentional weight loss,  MEDICAL HISTORY:  Past Medical History:  Diagnosis Date   Abnormal EKG 11/19/2018   Anemia    Anxiety    Anxiety and depression 11/19/2018   Arthritis    Asthma    Chronic sciatica    Constipation    Degenerative disc disease    Depression    GERD (gastroesophageal reflux disease)    Heart murmur    Hypercholesterolemia  11/19/2018   Hypothyroidism    Irritable bowel syndrome (IBS)    Nausea and vomiting    Sciatica 11/19/2018   Thyroid disease    Vertigo    with sinus issues.  none since 01/17    SURGICAL HISTORY: Past Surgical History:  Procedure Laterality Date   ABDOMINAL HYSTERECTOMY  09/15/1988   BREAST LUMPECTOMY Left 2002   CARPAL TUNNEL RELEASE Right    CHOLECYSTECTOMY     COLONOSCOPY WITH PROPOFOL  N/A 11/11/2017   Procedure: COLONOSCOPY WITH PROPOFOL ;  Surgeon: Gaylyn Gladis PENNER, MD;  Location: [facility] ENDOSCOPY;  Service: Endoscopy;  Laterality: N/A;   COLONOSCOPY WITH PROPOFOL  N/A 07/03/2018   Procedure: COLONOSCOPY WITH PROPOFOL ;  Surgeon: Gaylyn Gladis PENNER, MD;  Location: [facility] ENDOSCOPY;  Service: Endoscopy;  Laterality: N/A;   ESOPHAGOGASTRODUODENOSCOPY (EGD) WITH PROPOFOL  N/A 11/11/2017   Procedure: ESOPHAGOGASTRODUODENOSCOPY (EGD) WITH PROPOFOL ;  Surgeon: Gaylyn Gladis PENNER, MD;  Location: [facility] ENDOSCOPY;  Service: Endoscopy;  Laterality: N/A;   EXCISION METACARPAL MASS Right 01/09/2024   Procedure: EXCISION FOREIGN BODY RIGHT RING FINGER;  Surgeon: Murrell Drivers, MD;  Location: [facility];  Service: Orthopedics;  Laterality: Right;   FOOT SURGERY  01/03/2010   GASTROPLASTY  07/21/2010   Lap nissan   JOINT REPLACEMENT Left 2024   KNEE ARTHROSCOPY Right 2015   KNEE ARTHROSCOPY WITH LATERAL MENISECTOMY Right 03/08/2016   Procedure: KNEE ARTHROSCOPY WITH PARTIAL LATERAL MENISECTOMY;  Surgeon: Toribio JULIANNA Chancy, MD;  Location: [facility];  Service: Orthopedics;  Laterality: Right;   KNEE ARTHROSCOPY WITH MEDIAL MENISECTOMY Right 03/08/2016   Procedure: RIGHT KNEE ARTHROSCOPY CHONDROPLASTY WITH MEDIAL MENISCECTOMY;  Surgeon: Toribio JULIANNA Chancy, MD;  Location: [facility];  Service: Orthopedics;  Laterality: Right;   nissen fundoplication with gastroplasty     OVARY SURGERY  07/19/1988   Removal   SHOULDER SURGERY Left 1996   TONSILLECTOMY       SOCIAL HISTORY: Social History   Socioeconomic History   Marital status: Married    Spouse name: Not on file   Number of children: 2   Years of education: Not on file   Highest education level: Not on file  Occupational History   Not on file  Tobacco Use   Smoking status: Never   Smokeless tobacco: Never  Vaping Use   Vaping status: Never Used  Substance and Sexual Activity   Alcohol use: Yes    Alcohol/week: 3.0 standard drinks of alcohol    Types: 3 Glasses of wine per week    Comment: rarely   Drug use: No   Sexual activity: Not Currently    Birth control/protection: Surgical    Comment: Hyst  Other Topics Concern   Not on file  Social History Narrative   Not on file   Social Drivers of Health   Financial Resource Strain: Low Risk  (06/30/2024)   Overall Financial Resource Strain (CARDIA)    Difficulty of Paying Living Expenses: Not very hard  Food Insecurity: No Food Insecurity (06/30/2024)   Hunger Vital Sign    Worried About Running Out of Food in the Last Year: Never true    Ran Out of Food in the Last Year: Never true  Transportation Needs: No Transportation Needs (06/30/2024)   PRAPARE - Administrator, Civil Service (Medical): No    Lack of Transportation (Non-Medical): No  Physical Activity: Sufficiently Active (05/25/2024)   Received from [facility]   Exercise Vital Sign    On average, how many days per week do you engage in moderate to strenuous exercise (like a brisk walk)?: 5 days    On average, how many minutes do you engage in exercise at this level?: 30 min  Stress: No Stress Concern Present (06/30/2024)   [employer] of Occupational Health - Occupational Stress Questionnaire    Feeling of Stress: Only a little  Recent Concern: Stress - Stress Concern Present (05/25/2024)   Received from [facility] of Occupational Health - Occupational Stress Questionnaire    Do you feel stress - tense, restless,  nervous, or anxious, or unable to sleep at night because your mind is troubled all the time - these days?: To some extent  Social Connections: Moderately Integrated (05/25/2024)   Received from [facility]   Social Network    How would you rate your social network (family, work, friends)?: Adequate participation with social networks  Intimate Partner Violence: Not At Risk (06/30/2024)   Humiliation, Afraid, Rape, and Kick questionnaire    Fear of Current or Ex-Partner: No    Emotionally Abused: No    Physically Abused: No    Sexually Abused: No    FAMILY HISTORY: Family History  Problem Relation Age of Onset   Cancer Father        Prostate,Lung   COPD Father    Leukemia Sister    Iron deficiency Sister    Multiple myeloma Brother    Cancer Brother        Prostate   Heart attack Brother    Cancer Brother        Prostate   Heart attack Brother    Multiple myeloma Son     ALLERGIES:  is allergic to amoxicillin-pot clavulanate, versed  [midazolam ], biaxin [clarithromycin], demerol [meperidine], oxycodone, percocet [oxycodone-acetaminophen ], sulfamethoxazole-trimethoprim, and tetracyclines & related.  MEDICATIONS:  Current Outpatient Medications  Medication Sig Dispense Refill   acetaminophen  (ACETAMINOPHEN  8 HOUR) 650 MG CR tablet Take 1 tablet (650 mg total) by mouth every 8 (eight) hours as needed for pain. 20 tablet 0   budesonide (PULMICORT) 0.5 MG/2ML nebulizer solution 2 (two) times daily.      Calcium Carbonate-Vitamin D  (CALCIUM + D PO) Take by mouth 2 (two) times daily.     citalopram (CELEXA) 20 MG tablet Take 40 mg by mouth daily.     dexlansoprazole  (DEXILANT ) 60 MG capsule Take 1 capsule (60 mg total) by mouth daily. 90 capsule 2   diazepam (VALIUM) 10 MG tablet Take 10 mg by mouth at bedtime as needed for anxiety.     HYDROcodone -acetaminophen  (NORCO/VICODIN) 5-325 MG tablet Take 1 tablet by mouth every 6 (six) hours as needed for  moderate pain (pain score 4-6).      levothyroxine (SYNTHROID, LEVOTHROID) 25 MCG tablet Take 50 mcg by mouth.     linaclotide (LINZESS) 72 MCG capsule Take 72 mcg by mouth daily before breakfast.     loratadine (CLARITIN) 10 MG tablet Take 10 mg by mouth at bedtime.     lovastatin (MEVACOR) 40 MG tablet Take by mouth.     montelukast (SINGULAIR) 10 MG tablet Take 10 mg by mouth at bedtime.     Multiple Vitamins-Minerals (PRESERVISION AREDS 2 PO) Take by mouth.     ondansetron  (ZOFRAN -ODT) 4 MG disintegrating tablet Take 4 mg by mouth every 12 (twelve) hours as needed.      prednisoLONE acetate (PRED FORTE) 1 % ophthalmic suspension 1 drop 4 (four) times daily.     pregabalin (LYRICA) 50 MG capsule Take 50 mg by mouth at bedtime.     venlafaxine XR (EFFEXOR XR) 37.5 MG 24 hr capsule Take 37.5 mg by mouth daily with breakfast.     vitamin B-12 (CYANOCOBALAMIN) 100 MCG tablet Take 100 mcg by mouth daily.     vitamin C (ASCORBIC ACID) 500 MG tablet Take 1,200 mg by mouth daily.     No current facility-administered medications for this visit.    Review of Systems  Constitutional:  Negative for appetite change, chills, fatigue and fever.  HENT:   Negative for hearing loss and voice change.   Eyes:  Negative for eye problems.  Respiratory:  Negative for chest tightness and cough.   Cardiovascular:  Negative for chest pain.  Gastrointestinal:  Negative for abdominal distention, abdominal pain and blood in stool.  Endocrine: Negative for hot flashes.  Genitourinary:  Negative for difficulty urinating and frequency.   Musculoskeletal:  Negative for arthralgias.  Skin:  Negative for itching and rash.  Neurological:  Negative for extremity weakness.  Hematological:  Negative for adenopathy.  Psychiatric/Behavioral:  Negative for confusion.    PHYSICAL EXAMINATION:  Vitals:   06/30/24 1451  BP: 129/62  Pulse: 89  Resp: 18  Temp: 97.9 F (36.6 C)  SpO2: 100%   Filed Weights   06/30/24 1451  Weight: 166 lb 14.4 oz (75.7  kg)    Physical Exam Constitutional:      General: She is not in acute distress. HENT:     Head: Normocephalic and atraumatic.  Eyes:     General: No scleral icterus. Cardiovascular:     Rate and Rhythm: Normal rate and regular rhythm.     Heart sounds: Normal heart sounds.  Pulmonary:     Effort: Pulmonary effort is normal. No respiratory distress.     Breath sounds: No wheezing.  Abdominal:     General: Bowel sounds are normal. There is no distension.     Palpations: Abdomen is soft.  Musculoskeletal:        General: No deformity. Normal range of motion.     Cervical back: Normal range of motion and neck supple.  Skin:    General: Skin is warm and dry.     Findings: No erythema or rash.  Neurological:     Mental Status: She is alert and oriented to person, place, and time. Mental status is at baseline.  Psychiatric:        Mood and Affect: Mood normal.     LABORATORY DATA:  I have reviewed the data as listed    Latest Ref Rng & Units 06/30/2024    3:40 PM 03/24/2023    1:35  PM 09/25/2012    1:00 PM  CBC  WBC 4.0 - 10.5 K/uL 7.7  8.0  6.4   Hemoglobin 12.0 - 15.0 g/dL 88.7  87.0  87.1   Hematocrit 36.0 - 46.0 % 35.1  39.9  38.6   Platelets 150 - 400 K/uL 423  329  305       Latest Ref Rng & Units 06/30/2024    3:40 PM 03/24/2023    1:35 PM 06/20/2011    4:29 PM  CMP  Glucose 70 - 99 mg/dL 86  897  93   BUN 8 - 23 mg/dL 13  11  15    Creatinine 0.44 - 1.00 mg/dL 9.28  9.38  9.17   Sodium 135 - 145 mmol/L 137  141  142   Potassium 3.5 - 5.1 mmol/L 3.9  4.0  4.4   Chloride 98 - 111 mmol/L 101  105  105   CO2 22 - 32 mmol/L 28  30  29    Calcium 8.9 - 10.3 mg/dL 9.0  9.4  9.8   Total Protein 6.5 - 8.1 g/dL 6.8     Total Bilirubin 0.0 - 1.2 mg/dL 0.6     Alkaline Phos 38 - 126 U/L 87     AST 15 - 41 U/L 20     ALT 0 - 44 U/L 15         RADIOGRAPHIC STUDIES: I have personally reviewed the radiological images as listed and agreed with the findings in the  report. CT Head Wo Contrast Result Date: 06/03/2024 CLINICAL DATA:  Facial trauma, blunt, fall, head injury to a EXAM: CT HEAD WITHOUT CONTRAST CT CERVICAL SPINE WITHOUT CONTRAST TECHNIQUE: Multidetector CT imaging of the head and cervical spine was performed following the standard protocol without intravenous contrast. Multiplanar CT image reconstructions of the cervical spine were also generated. RADIATION DOSE REDUCTION: This exam was performed according to the departmental dose-optimization program which includes automated exposure control, adjustment of the mA and/or kV according to patient size and/or use of iterative reconstruction technique. COMPARISON:  None Available. FINDINGS: CT HEAD FINDINGS Brain: Normal anatomic configuration. No abnormal intra or extra-axial mass lesion or fluid collection. No abnormal mass effect or midline shift. No evidence of acute intracranial hemorrhage or infarct. Ventricular size is normal. Cerebellum unremarkable. Vascular: Unremarkable Skull: Intact Sinuses/Orbits: Paranasal sinuses are clear. Orbits are unremarkable. Other: Mastoid air cells and middle ear cavities are clear. Small left frontal scalp hematoma CT CERVICAL SPINE FINDINGS Alignment: 2 mm anterolisthesis C4-5, likely degenerative in nature Skull base and vertebrae: Grade cervical alignment is normal. The atlantodental interval is not widened. No acute fracture of the cervical spine. Vertebral body height is preserved. Soft tissues and spinal canal: No prevertebral fluid or swelling. No visible canal hematoma. Disc levels: Intervertebral disc heights are preserved. Prevertebral soft tissues are not thickened on sagittal reformats. Asymmetric facet arthrosis results in moderate left far neuroforaminal narrowing at C3-4 and C4-5. No high-grade canal stenosis. Upper chest: Negative. Other: Moderate atherosclerotic calcification with left carotid bifurcation. IMPRESSION: 1. No acute intracranial abnormality. No  calvarial fracture. Small left frontal scalp hematoma. 2. No acute fracture or listhesis of the cervical spine. Electronically Signed   By: Dorethia Molt M.D.   On: 06/03/2024 19:47   CT Cervical Spine Wo Contrast Result Date: 06/03/2024 CLINICAL DATA:  Facial trauma, blunt, fall, head injury to a EXAM: CT HEAD WITHOUT CONTRAST CT CERVICAL SPINE WITHOUT CONTRAST TECHNIQUE: Multidetector CT imaging of the head and  cervical spine was performed following the standard protocol without intravenous contrast. Multiplanar CT image reconstructions of the cervical spine were also generated. RADIATION DOSE REDUCTION: This exam was performed according to the departmental dose-optimization program which includes automated exposure control, adjustment of the mA and/or kV according to patient size and/or use of iterative reconstruction technique. COMPARISON:  None Available. FINDINGS: CT HEAD FINDINGS Brain: Normal anatomic configuration. No abnormal intra or extra-axial mass lesion or fluid collection. No abnormal mass effect or midline shift. No evidence of acute intracranial hemorrhage or infarct. Ventricular size is normal. Cerebellum unremarkable. Vascular: Unremarkable Skull: Intact Sinuses/Orbits: Paranasal sinuses are clear. Orbits are unremarkable. Other: Mastoid air cells and middle ear cavities are clear. Small left frontal scalp hematoma CT CERVICAL SPINE FINDINGS Alignment: 2 mm anterolisthesis C4-5, likely degenerative in nature Skull base and vertebrae: Grade cervical alignment is normal. The atlantodental interval is not widened. No acute fracture of the cervical spine. Vertebral body height is preserved. Soft tissues and spinal canal: No prevertebral fluid or swelling. No visible canal hematoma. Disc levels: Intervertebral disc heights are preserved. Prevertebral soft tissues are not thickened on sagittal reformats. Asymmetric facet arthrosis results in moderate left far neuroforaminal narrowing at C3-4 and  C4-5. No high-grade canal stenosis. Upper chest: Negative. Other: Moderate atherosclerotic calcification with left carotid bifurcation. IMPRESSION: 1. No acute intracranial abnormality. No calvarial fracture. Small left frontal scalp hematoma. 2. No acute fracture or listhesis of the cervical spine. Electronically Signed   By: Dorethia Molt M.D.   On: 06/03/2024 19:47   DG Femur Min 2 Views Right Result Date: 06/03/2024 CLINICAL DATA:  Fall and right hip pain. EXAM: RIGHT FEMUR 2 VIEWS COMPARISON:  None Available. FINDINGS: There is no acute fracture or dislocation. Mild osteopenia. There is mild arthritic changes of the right knee. No joint effusion. The soft tissues are unremarkable. IMPRESSION: 1. No acute fracture or dislocation. 2. Mild arthritic changes of the right knee. Electronically Signed   By: Vanetta Chou M.D.   On: 06/03/2024 18:45

## 2024-07-01 LAB — KAPPA/LAMBDA LIGHT CHAINS
Kappa free light chain: 23.1 mg/L — ABNORMAL HIGH (ref 3.3–19.4)
Kappa, lambda light chain ratio: 0.98 (ref 0.26–1.65)
Lambda free light chains: 23.6 mg/L (ref 5.7–26.3)

## 2024-07-02 ENCOUNTER — Encounter: Payer: Self-pay | Admitting: Cardiology

## 2024-07-02 LAB — MULTIPLE MYELOMA PANEL, SERUM
Albumin SerPl Elph-Mcnc: 3.3 g/dL (ref 2.9–4.4)
Albumin/Glob SerPl: 1.2 (ref 0.7–1.7)
Alpha 1: 0.3 g/dL (ref 0.0–0.4)
Alpha2 Glob SerPl Elph-Mcnc: 0.9 g/dL (ref 0.4–1.0)
B-Globulin SerPl Elph-Mcnc: 1.1 g/dL (ref 0.7–1.3)
Gamma Glob SerPl Elph-Mcnc: 0.8 g/dL (ref 0.4–1.8)
Globulin, Total: 3 g/dL (ref 2.2–3.9)
IgA: 267 mg/dL (ref 64–422)
IgG (Immunoglobin G), Serum: 935 mg/dL (ref 586–1602)
IgM (Immunoglobulin M), Srm: 105 mg/dL (ref 26–217)
Total Protein ELP: 6.3 g/dL (ref 6.0–8.5)

## 2024-07-04 ENCOUNTER — Ambulatory Visit: Payer: Self-pay | Admitting: Oncology

## 2024-07-06 ENCOUNTER — Ambulatory Visit (HOSPITAL_COMMUNITY)
Admission: RE | Admit: 2024-07-06 | Discharge: 2024-07-06 | Disposition: A | Discharge status: Home / Self Care | Source: Ambulatory Visit | Attending: Cardiology | Admitting: Cardiology

## 2024-07-06 ENCOUNTER — Ambulatory Visit: Payer: Self-pay | Admitting: Cardiology

## 2024-07-06 ENCOUNTER — Other Ambulatory Visit: Payer: Self-pay

## 2024-07-06 DIAGNOSIS — R0989 Other specified symptoms and signs involving the circulatory and respiratory systems: Secondary | ICD-10-CM | POA: Diagnosis present

## 2024-07-06 DIAGNOSIS — Z807 Family history of other malignant neoplasms of lymphoid, hematopoietic and related tissues: Secondary | ICD-10-CM

## 2024-07-06 DIAGNOSIS — D508 Other iron deficiency anemias: Secondary | ICD-10-CM

## 2024-07-06 LAB — CBC WITH DIFFERENTIAL/PLATELET

## 2024-07-06 LAB — LIPID PANEL

## 2024-07-07 ENCOUNTER — Ambulatory Visit: Payer: Self-pay | Admitting: Cardiology

## 2024-07-07 LAB — COMPREHENSIVE METABOLIC PANEL WITH GFR
ALT: 15 IU/L (ref 0–32)
AST: 18 IU/L (ref 0–40)
Albumin: 4 g/dL (ref 3.8–4.8)
Alkaline Phosphatase: 110 IU/L (ref 49–135)
BUN/Creatinine Ratio: 17 (ref 12–28)
BUN: 10 mg/dL (ref 8–27)
Bilirubin Total: 0.3 mg/dL (ref 0.0–1.2)
CO2: 25 mmol/L (ref 20–29)
Calcium: 9.4 mg/dL (ref 8.7–10.3)
Chloride: 101 mmol/L (ref 96–106)
Creatinine, Ser: 0.59 mg/dL (ref 0.57–1.00)
Globulin, Total: 2.5 g/dL (ref 1.5–4.5)
Glucose: 83 mg/dL (ref 70–99)
Potassium: 4.2 mmol/L (ref 3.5–5.2)
Sodium: 140 mmol/L (ref 134–144)
Total Protein: 6.5 g/dL (ref 6.0–8.5)
eGFR: 95 mL/min/1.73 (ref >59)

## 2024-07-07 LAB — CBC WITH DIFFERENTIAL/PLATELET
Basos: 1 %
EOS (ABSOLUTE): 0.1 x10E3/uL (ref 0.0–0.2)
Eos: 2 %
Hematocrit: 38.6 % (ref 34.0–46.6)
Hemoglobin: 12.1 g/dL (ref 11.1–15.9)
Immature Granulocytes: 0 %
Immature Granulocytes: 0 x10E3/uL (ref 0.0–0.1)
Lymphs: 18 %
MCH: 28.6 pg (ref 26.6–33.0)
MCHC: 31.3 g/dL — AB (ref 31.5–35.7)
MCV: 91 fL (ref 79–97)
Monocytes Absolute: 0.2 x10E3/uL (ref 0.0–0.4)
Monocytes Absolute: 1 x10E3/uL — AB (ref 0.1–0.9)
Monocytes: 9 %
Neutrophils Absolute: 1.9 x10E3/uL (ref 0.7–3.1)
Neutrophils Absolute: 7.5 x10E3/uL — AB (ref 1.4–7.0)
Neutrophils: 70 %
Platelets: 488 x10E3/uL — AB (ref 150–450)
RBC: 4.23 x10E6/uL (ref 3.77–5.28)
RDW: 12 % (ref 11.7–15.4)
WBC: 10.6 x10E3/uL (ref 3.4–10.8)

## 2024-07-07 LAB — LIPID PANEL
Cholesterol, Total: 181 mg/dL (ref 100–199)
HDL: 76 mg/dL (ref >39)
LDL CALC COMMENT:: 2.4 ratio (ref 0.0–4.4)
LDL Chol Calc (NIH): 88 mg/dL (ref 0–99)
Triglycerides: 92 mg/dL (ref 0–149)
VLDL Cholesterol Cal: 17 mg/dL (ref 5–40)

## 2024-07-18 NOTE — Progress Notes (Unsigned)
 Cardiology Office Note:    Date:  07/22/2024   ID:  Tamara Williams, DOB December 26, 1950, MRN [id number]  PCP:  Suanne Pfeiffer, NP    HeartCare Providers Cardiologist:  None     Referring MD: Suanne Pfeiffer, NP   No chief complaint on file.   History of Present Illness:    Tamara Williams is a 73 y.o. female is seen to establish cardiac care. Previously followed by Dr Ladona but since I see her husband she wished to be seen by me. She had a Myoview study in 2017 that was normal. Echo July 2024 was normal. Recent carotid dopplers showed no significant blockage.   She is doing well in follow up. She was seen in September in the ED after a fall. CT head and neck done with no acute issues but did note calcification in the carotids. She subsequently had dopplers done showing no obstruction. She does have a history of statin intolerance to Crestor, lipitor and Livalo but has been able to tolerate Mevacor OK. No history of HTN, DM, tobacco use. Denies any chest pain, dyspnea or palpitations. Reviewing CT abd/pelvis in 2027 she did have mild aortic calcification. No coronary calcification seen.   Past Medical History:  Diagnosis Date   Abnormal EKG 11/19/2018   Anemia    Anxiety    Anxiety and depression 11/19/2018   Arthritis    Asthma    Chronic sciatica    Constipation    Degenerative disc disease    Depression    GERD (gastroesophageal reflux disease)    Heart murmur    Hypercholesterolemia 11/19/2018   Hypothyroidism    Irritable bowel syndrome (IBS)    Nausea and vomiting    Sciatica 11/19/2018   Thyroid disease    Vertigo    with sinus issues.  none since 01/17    Past Surgical History:  Procedure Laterality Date   ABDOMINAL HYSTERECTOMY  09/15/1988   BREAST LUMPECTOMY Left 2002   CARPAL TUNNEL RELEASE Right    CHOLECYSTECTOMY     COLONOSCOPY WITH PROPOFOL  N/A 11/11/2017   Procedure: COLONOSCOPY WITH PROPOFOL ;  Surgeon: Gaylyn Gladis PENNER, MD;  Location: [facility]  ENDOSCOPY;  Service: Endoscopy;  Laterality: N/A;   COLONOSCOPY WITH PROPOFOL  N/A 07/03/2018   Procedure: COLONOSCOPY WITH PROPOFOL ;  Surgeon: Gaylyn Gladis PENNER, MD;  Location: [facility] ENDOSCOPY;  Service: Endoscopy;  Laterality: N/A;   ESOPHAGOGASTRODUODENOSCOPY (EGD) WITH PROPOFOL  N/A 11/11/2017   Procedure: ESOPHAGOGASTRODUODENOSCOPY (EGD) WITH PROPOFOL ;  Surgeon: Gaylyn Gladis PENNER, MD;  Location: [facility] ENDOSCOPY;  Service: Endoscopy;  Laterality: N/A;   EXCISION METACARPAL MASS Right 01/09/2024   Procedure: EXCISION FOREIGN BODY RIGHT RING FINGER;  Surgeon: Murrell Drivers, MD;  Location: [facility];  Service: Orthopedics;  Laterality: Right;   FOOT SURGERY  01/03/2010   GASTROPLASTY  07/21/2010   Lap nissan   JOINT REPLACEMENT Left 2024   KNEE ARTHROSCOPY Right 2015   KNEE ARTHROSCOPY WITH LATERAL MENISECTOMY Right 03/08/2016   Procedure: KNEE ARTHROSCOPY WITH PARTIAL LATERAL MENISECTOMY;  Surgeon: Toribio JULIANNA Chancy, MD;  Location: [facility];  Service: Orthopedics;  Laterality: Right;   KNEE ARTHROSCOPY WITH MEDIAL MENISECTOMY Right 03/08/2016   Procedure: RIGHT KNEE ARTHROSCOPY CHONDROPLASTY WITH MEDIAL MENISCECTOMY;  Surgeon: Toribio JULIANNA Chancy, MD;  Location:  SURGERY CENTER;  Service: Orthopedics;  Laterality: Right;   nissen fundoplication with gastroplasty     OVARY SURGERY  07/19/1988   Removal   SHOULDER SURGERY Left 1996   TONSILLECTOMY  Current Medications: Current Meds  Medication Sig   acetaminophen  (ACETAMINOPHEN  8 HOUR) 650 MG CR tablet Take 1 tablet (650 mg total) by mouth every 8 (eight) hours as needed for pain.   baclofen (LIORESAL) 10 MG tablet Take 10 mg by mouth 3 (three) times daily.   budesonide (PULMICORT) 0.5 MG/2ML nebulizer solution 2 (two) times daily.    citalopram (CELEXA) 20 MG tablet Take 40 mg by mouth daily.   dexlansoprazole  (DEXILANT ) 60 MG capsule Take 1 capsule (60 mg total) by mouth daily.   diazepam (VALIUM)  10 MG tablet Take 10 mg by mouth at bedtime as needed for anxiety.   HYDROcodone -acetaminophen  (NORCO/VICODIN) 5-325 MG tablet Take 1 tablet by mouth every 6 (six) hours as needed for moderate pain (pain score 4-6).   levothyroxine (SYNTHROID, LEVOTHROID) 25 MCG tablet Take 50 mcg by mouth.   loratadine (CLARITIN) 10 MG tablet Take 10 mg by mouth at bedtime.   lovastatin (MEVACOR) 40 MG tablet Take by mouth.   montelukast (SINGULAIR) 10 MG tablet Take 10 mg by mouth at bedtime.   Multiple Vitamins-Minerals (PRESERVISION AREDS 2 PO) Take by mouth.   ondansetron  (ZOFRAN -ODT) 4 MG disintegrating tablet Take 4 mg by mouth every 12 (twelve) hours as needed.    prednisoLONE acetate (PRED FORTE) 1 % ophthalmic suspension 1 drop 4 (four) times daily.   pregabalin (LYRICA) 50 MG capsule Take 50 mg by mouth at bedtime.   venlafaxine XR (EFFEXOR XR) 37.5 MG 24 hr capsule Take 37.5 mg by mouth daily with breakfast.   vitamin B-12 (CYANOCOBALAMIN) 100 MCG tablet Take 100 mcg by mouth daily.   vitamin C (ASCORBIC ACID) 500 MG tablet Take 1,200 mg by mouth daily.     Allergies:   Amoxicillin-pot clavulanate, Versed  [midazolam ], Biaxin [clarithromycin], Demerol [meperidine], Oxycodone, Percocet [oxycodone-acetaminophen ], Sulfamethoxazole-trimethoprim, and Tetracyclines & related   Social History   Socioeconomic History   Marital status: Married    Spouse name: Not on file   Number of children: 2   Years of education: Not on file   Highest education level: Not on file  Occupational History   Not on file  Tobacco Use   Smoking status: Never   Smokeless tobacco: Never  Vaping Use   Vaping status: Never Used  Substance and Sexual Activity   Alcohol use: Yes    Alcohol/week: 3.0 standard drinks of alcohol    Types: 3 Glasses of wine per week    Comment: rarely   Drug use: No   Sexual activity: Not Currently    Birth control/protection: Surgical    Comment: Hyst  Other Topics Concern   Not on  file  Social History Narrative   Not on file   Social Drivers of Health   Financial Resource Strain: Low Risk  (06/30/2024)   Overall Financial Resource Strain (CARDIA)    Difficulty of Paying Living Expenses: Not very hard  Food Insecurity: No Food Insecurity (06/30/2024)   Hunger Vital Sign    Worried About Running Out of Food in the Last Year: Never true    Ran Out of Food in the Last Year: Never true  Transportation Needs: No Transportation Needs (06/30/2024)   PRAPARE - Administrator, Civil Service (Medical): No    Lack of Transportation (Non-Medical): No  Physical Activity: Sufficiently Active (05/25/2024)   Received from [facility]   Exercise Vital Sign    On average, how many days per week do you engage in moderate to strenuous  exercise (like a brisk walk)?: 5 days    On average, how many minutes do you engage in exercise at this level?: 30 min  Stress: No Stress Concern Present (06/30/2024)   [employer] of Occupational Health - Occupational Stress Questionnaire    Feeling of Stress: Only a little  Recent Concern: Stress - Stress Concern Present (05/25/2024)   Received from [facility] of Occupational Health - Occupational Stress Questionnaire    Do you feel stress - tense, restless, nervous, or anxious, or unable to sleep at night because your mind is troubled all the time - these days?: To some extent  Social Connections: Moderately Integrated (05/25/2024)   Received from [employer] [facility]   Social Network    How would you rate your social network (family, work, friends)?: Adequate participation with social networks     Family History: The patient's family history includes COPD in her father; Cancer in her brother, brother, and father; Heart attack in her brother and brother; Iron deficiency in her sister; Leukemia in her sister; Multiple myeloma in her brother and son.  ROS:   Please see the history of present illness.     All other  systems reviewed and are negative.  EKGs/Labs/Other Studies Reviewed:    The following studies were reviewed today:  EKG Interpretation Date/Time:  Wednesday July 22 2024 09:13:05 EDT Ventricular Rate:  80 PR Interval:  142 QRS Duration:  92 QT Interval:  364 QTC Calculation: 419 R Axis:   25  Text Interpretation: Normal sinus rhythm Normal ECG When compared with ECG of 24-Mar-2023 12:59, No significant change was found Confirmed by [place], Mckinzey Entwistle [phone]) on 07/22/2024 9:18:43 AM    Echocardiogram 04/12/2023:  Normal LV systolic function with EF 57%. Left ventricle cavity is normal  in size. Normal left ventricular wall thickness. Normal global wall  motion. Normal diastolic filling pattern, normal LAP. Calculated EF 57%.  Left atrial cavity is mildly dilated.  Structurally normal tricuspid valve. Mild tricuspid regurgitation. No  evidence of pulmonary hypertension.  Compared to the study from 10/09/2018, mild pulmonary hypertension not  present and aortic valve is well see without significant aortic sclerosis  or stenosis   Nuclear stress test  09/19/2016: 1. Resting EKG demonstrates normal sinus rhythm, normal axis, poor R-wave progression. Stress EKG is non-diagnostic for ischemia as it a pharmacologic stress using Lexiscan.Stress symptoms included dyspnea. 2. Myocardial perfusion imaging is normal. Overall left ventricular systolic function was normal without regional wall motion abnormalities. The left ventricular ejection fraction was 74%.  EKG Interpretation Date/Time:  Wednesday July 22 2024 09:13:05 EDT Ventricular Rate:  80 PR Interval:  142 QRS Duration:  92 QT Interval:  364 QTC Calculation: 419 R Axis:   25  Text Interpretation: Normal sinus rhythm Normal ECG When compared with ECG of 24-Mar-2023 12:59, No significant change was found Confirmed by [place], Fed Ceci [phone]) on 07/22/2024 9:18:43 AM    Recent Labs: 07/06/2024: ALT 15; BUN 10; Creatinine, Ser 0.59;  Hemoglobin 12.1; Platelets 488; Potassium 4.2; Sodium 140  Recent Lipid Panel    Component Value Date/Time   CHOL 181 07/06/2024 0817   TRIG 92 07/06/2024 0817   HDL 76 07/06/2024 0817   CHOLHDL 2.4 07/06/2024 0817   LDLCALC 88 07/06/2024 0817     Risk Assessment/Calculations:                Physical Exam:    VS:  BP 126/76   Pulse 80   Ht  5' 1 (1.549 m)   Wt 165 lb 3.2 oz (74.9 kg)   SpO2 95%   BMI 31.21 kg/m     Wt Readings from Last 3 Encounters:  07/22/24 165 lb 3.2 oz (74.9 kg)  06/30/24 166 lb 14.4 oz (75.7 kg)  01/09/24 156 lb 8.4 oz (71 kg)     GEN:  Well nourished, well developed in no acute distress HEENT: Normal NECK: No JVD; No carotid bruits LYMPHATICS: No lymphadenopathy CARDIAC: RRR, 1/6 systolic murmur, rubs, gallops RESPIRATORY:  Clear to auscultation without rales, wheezing or rhonchi  ABDOMEN: Soft, non-tender, non-distended MUSCULOSKELETAL:  No edema; No deformity  SKIN: Warm and dry NEUROLOGIC:  Alert and oriented x 3 PSYCHIATRIC:  Normal affect   ASSESSMENT:    1. Aortic systolic murmur on examination   2. Hypercholesteremia    PLAN:    In order of problems listed above:  Mild murmur on exam but prior Echo normal. No concerns. Aortic and carotid atherosclerosis based on calcification. No carotid obstruction on dopplers. Reassured. HLD on mevacor. Intolerant to other statins. Continue Rx. Heart healthy diet.         Follow up in one year    Medication Adjustments/Labs and Tests Ordered: Current medicines are reviewed at length with the patient today.  Concerns regarding medicines are outlined above.  Orders Placed This Encounter  Procedures   EKG 12-Lead   No orders of the defined types were placed in this encounter.   There are no Patient Instructions on file for this visit.   Signed, Emiko Osorto [place], MD  07/22/2024 9:28 AM    [facility]

## 2024-07-22 ENCOUNTER — Encounter: Payer: Self-pay | Admitting: Cardiology

## 2024-07-22 ENCOUNTER — Ambulatory Visit: Attending: Cardiology | Admitting: Cardiology

## 2024-07-22 VITALS — BP 126/76 | HR 80 | Ht 61.0 in | Wt 165.2 lb

## 2024-07-22 DIAGNOSIS — I358 Other nonrheumatic aortic valve disorders: Secondary | ICD-10-CM | POA: Diagnosis not present

## 2024-07-22 DIAGNOSIS — E7800 Pure hypercholesterolemia, unspecified: Secondary | ICD-10-CM | POA: Diagnosis not present

## 2024-07-22 NOTE — Patient Instructions (Addendum)
 Medication Instructions:  Your Doctor recommends continuing with your current medication *If you need a refill on your cardiac medications before your next appointment, please call your pharmacy*  Lab Work: None ordered If you have labs (blood work) drawn today and your tests are completely normal, you will receive your results only by: MyChart Message (if you have MyChart) OR A paper copy in the mail If you have any lab test that is abnormal or we need to change your treatment, we will call you to review the results.  Testing/Procedures: None ordered  Follow-Up: At [facility], you and your health needs are our priority.  As part of our continuing mission to provide you with exceptional heart care, our providers are all part of one team.  This team includes your primary Cardiologist (physician) and Advanced Practice Providers or APPs (Physician Assistants and Nurse Practitioners) who all work together to provide you with the care you need, when you need it.  Your next appointment:   1 year(s)  Provider:   Peter [place], MD    We recommend signing up for the patient portal called MyChart.  Sign up information is provided on this After Visit Summary.  MyChart is used to connect with patients for Virtual Visits (Telemedicine).  Patients are able to view lab/test results, encounter notes, upcoming appointments, etc.  Non-urgent messages can be sent to your provider as well.   To learn more about what you can do with MyChart, go to [email or web address].

## 2024-08-14 ENCOUNTER — Encounter: Payer: Self-pay | Admitting: Obstetrics & Gynecology

## 2024-08-14 ENCOUNTER — Ambulatory Visit: Admitting: Obstetrics & Gynecology

## 2024-08-14 VITALS — BP 109/67 | HR 81 | Ht 61.0 in | Wt 166.0 lb

## 2024-08-14 DIAGNOSIS — N958 Other specified menopausal and perimenopausal disorders: Secondary | ICD-10-CM | POA: Diagnosis not present

## 2024-08-14 DIAGNOSIS — N993 Prolapse of vaginal vault after hysterectomy: Secondary | ICD-10-CM | POA: Diagnosis not present

## 2024-08-14 DIAGNOSIS — N8110 Cystocele, unspecified: Secondary | ICD-10-CM

## 2024-08-14 MED ORDER — ESTRADIOL 10 MCG VA TABS: 1.0000 | ORAL_TABLET | VAGINAL | 11 refills | Status: AC

## 2024-08-14 NOTE — Progress Notes (Signed)
 Chief Complaint  Patient presents with   Vaginal Prolapse    Blood pressure 109/67, pulse 81, height 5' 1 (1.549 m), weight 166 lb (75.3 kg).  Tamara Williams presents today for evaluation vaginal vault prolapse Exam reveals apical prolpase with secondary bladder relaxation Sheis fit for Milex ring with support #3 She reports no vaginal discharge and no vaginal bleeding  Severe vaginal atrophy-->GUS of menopause  Likert scale(1 not bothersome -5 very bothersome)  :  1  Exam reveals no undue vaginal mucosal pressure of breakdown, no discharge and no vaginal bleeding.  Vaginal Epithelial Abnormality Classification System:   0 0    No abnormalities 1    Epithelial erythema 2    Granulation tissue 3    Epithelial break or erosion, 1 cm or less 4    Epithelial break or erosion, 1 cm or greater  The pessary is removed, cleaned and replaced without difficulty.      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 Baden-Walker grade 2 cystocele  N81.10     3. Genitourinary syndrome of menopause: Yuvafem twice weekly  N95.8        [name] will be sen back in 1 months for continued follow up.  Vonn VEAR Inch, MD  08/14/2024 9:50 AM

## 2024-08-17 ENCOUNTER — Telehealth: Payer: Self-pay | Admitting: Obstetrics & Gynecology

## 2024-08-17 NOTE — Telephone Encounter (Signed)
Called patient back and left message that I am returning her call.  

## 2024-08-17 NOTE — Telephone Encounter (Signed)
 Patient has a question about her pessary. Please advise.

## 2024-08-17 NOTE — Telephone Encounter (Signed)
 Spoke with patient. She had concerns about pessary moving when she had a bowel movement. Pt instructed to gently press the pessary back into place. No other questions at this time.

## 2024-09-15 ENCOUNTER — Ambulatory Visit: Admitting: Obstetrics & Gynecology

## 2024-09-21 ENCOUNTER — Ambulatory Visit: Admitting: Obstetrics & Gynecology

## 2024-09-21 ENCOUNTER — Encounter: Payer: Self-pay | Admitting: Obstetrics & Gynecology

## 2024-09-21 VITALS — BP 125/70 | HR 81 | Ht 61.0 in | Wt 170.0 lb

## 2024-09-21 DIAGNOSIS — N958 Other specified menopausal and perimenopausal disorders: Secondary | ICD-10-CM | POA: Diagnosis not present

## 2024-09-21 DIAGNOSIS — Z4689 Encounter for fitting and adjustment of other specified devices: Secondary | ICD-10-CM | POA: Diagnosis not present

## 2024-09-21 DIAGNOSIS — N993 Prolapse of vaginal vault after hysterectomy: Secondary | ICD-10-CM

## 2024-09-21 DIAGNOSIS — N8110 Cystocele, unspecified: Secondary | ICD-10-CM

## 2024-09-21 DIAGNOSIS — Z79890 Hormone replacement therapy (postmenopausal): Secondary | ICD-10-CM | POA: Diagnosis not present

## 2024-09-21 NOTE — Progress Notes (Signed)
 Chief Complaint  Patient presents with   Pessary Check    Blood pressure 125/70, pulse 81, height 5' 1 (1.549 m), weight 170 lb (77.1 kg).  Tamara Williams Sorrel presents today for routine follow up related to her pessary.   She uses a Milex ring with support #3 She reports no vaginal discharge and no vaginal bleeding   Likert scale(1 not bothersome -5 very bothersome)  :  1  Exam reveals no undue vaginal mucosal pressure of breakdown, no discharge and no vaginal bleeding.  Vaginal Epithelial Abnormality Classification System:   0 0    No abnormalities 1    Epithelial erythema 2    Granulation tissue 3    Epithelial break or erosion, 1 cm or less 4    Epithelial break or erosion, 1 cm or greater  The pessary is removed, cleaned and replaced without difficulty.      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 Baden-Walker grade 2 cystocele  N81.10     3. Genitourinary syndrome of menopause: Yuvafem  twice weekly  N95.8        Ashland L Rajan will be sen back in 3 months for continued follow up.  Vonn VEAR Inch, MD  09/21/2024 11:35 AM

## 2024-10-06 ENCOUNTER — Ambulatory Visit: Admitting: Obstetrics & Gynecology

## 2024-10-06 ENCOUNTER — Inpatient Hospital Stay: Attending: Oncology

## 2024-10-06 DIAGNOSIS — D508 Other iron deficiency anemias: Secondary | ICD-10-CM

## 2024-10-06 LAB — CBC (CANCER CENTER ONLY)
HCT: 38.1 % (ref 36.0–46.0)
Hemoglobin: 12.2 g/dL (ref 12.0–15.0)
MCH: 29 pg (ref 26.0–34.0)
MCHC: 32 g/dL (ref 30.0–36.0)
MCV: 90.7 fL (ref 80.0–100.0)
Platelet Count: 345 K/uL (ref 150–400)
RBC: 4.2 MIL/uL (ref 3.87–5.11)
RDW: 13.2 % (ref 11.5–15.5)
WBC Count: 7.3 K/uL (ref 4.0–10.5)
nRBC: 0 % (ref 0.0–0.2)

## 2024-10-06 LAB — FERRITIN: Ferritin: 32 ng/mL (ref 11–307)

## 2024-10-06 LAB — IRON AND TIBC
Iron: 59 ug/dL (ref 28–170)
Saturation Ratios: 16 % (ref 10.4–31.8)
TIBC: 364 ug/dL (ref 250–450)
UIBC: 305 ug/dL

## 2024-10-12 ENCOUNTER — Telehealth: Payer: Self-pay | Admitting: Oncology

## 2024-10-12 NOTE — Telephone Encounter (Signed)
 Patient called to reschedule appointment due to being sick and not wanting to share with everyone. She has been rescheduled to next available appointment

## 2024-10-13 ENCOUNTER — Ambulatory Visit: Admitting: Oncology

## 2024-11-30 ENCOUNTER — Inpatient Hospital Stay: Admitting: Oncology

## 2024-12-21 ENCOUNTER — Ambulatory Visit: Admitting: Obstetrics & Gynecology
